# Patient Record
Sex: Female | Born: 1970 | Race: White | Hispanic: No | Smoking: Current every day smoker
Health system: Southern US, Community
[De-identification: ages and names within clinical notes are randomized; demographics above are authoritative.]

## PROBLEM LIST (undated history)

## (undated) DIAGNOSIS — T8859XA Other complications of anesthesia, initial encounter: Secondary | ICD-10-CM

## (undated) DIAGNOSIS — G5603 Carpal tunnel syndrome, bilateral upper limbs: Secondary | ICD-10-CM

## (undated) DIAGNOSIS — J45909 Unspecified asthma, uncomplicated: Secondary | ICD-10-CM

## (undated) DIAGNOSIS — J302 Other seasonal allergic rhinitis: Secondary | ICD-10-CM

## (undated) DIAGNOSIS — N159 Renal tubulo-interstitial disease, unspecified: Secondary | ICD-10-CM

## (undated) DIAGNOSIS — M75102 Unspecified rotator cuff tear or rupture of left shoulder, not specified as traumatic: Secondary | ICD-10-CM

## (undated) DIAGNOSIS — M25812 Other specified joint disorders, left shoulder: Secondary | ICD-10-CM

## (undated) DIAGNOSIS — Z972 Presence of dental prosthetic device (complete) (partial): Secondary | ICD-10-CM

## (undated) DIAGNOSIS — F32A Depression, unspecified: Secondary | ICD-10-CM

## (undated) DIAGNOSIS — J069 Acute upper respiratory infection, unspecified: Secondary | ICD-10-CM

## (undated) DIAGNOSIS — M7542 Impingement syndrome of left shoulder: Secondary | ICD-10-CM

## (undated) DIAGNOSIS — Z720 Tobacco use: Secondary | ICD-10-CM

## (undated) DIAGNOSIS — J381 Polyp of vocal cord and larynx: Secondary | ICD-10-CM

## (undated) DIAGNOSIS — M1990 Unspecified osteoarthritis, unspecified site: Secondary | ICD-10-CM

## (undated) DIAGNOSIS — K219 Gastro-esophageal reflux disease without esophagitis: Secondary | ICD-10-CM

## (undated) DIAGNOSIS — F329 Major depressive disorder, single episode, unspecified: Secondary | ICD-10-CM

## (undated) DIAGNOSIS — F419 Anxiety disorder, unspecified: Secondary | ICD-10-CM

## (undated) DIAGNOSIS — Z8719 Personal history of other diseases of the digestive system: Secondary | ICD-10-CM

## (undated) DIAGNOSIS — T4145XA Adverse effect of unspecified anesthetic, initial encounter: Secondary | ICD-10-CM

## (undated) DIAGNOSIS — Z973 Presence of spectacles and contact lenses: Secondary | ICD-10-CM

## (undated) DIAGNOSIS — J309 Allergic rhinitis, unspecified: Secondary | ICD-10-CM

## (undated) DIAGNOSIS — J449 Chronic obstructive pulmonary disease, unspecified: Secondary | ICD-10-CM

## (undated) HISTORY — DX: Renal tubulo-interstitial disease, unspecified: N15.9

## (undated) HISTORY — PX: TONSILLECTOMY: SUR1361

## (undated) HISTORY — PX: ABDOMINAL HYSTERECTOMY: SHX81

## (undated) HISTORY — PX: CHOLECYSTECTOMY: SHX55

## (undated) HISTORY — DX: Acute upper respiratory infection, unspecified: J06.9

## (undated) HISTORY — DX: Tobacco use: Z72.0

## (undated) HISTORY — PX: MICROLARYNGOSCOPY: SHX5208

## (undated) HISTORY — DX: Polyp of vocal cord and larynx: J38.1

## (undated) HISTORY — DX: Carpal tunnel syndrome, bilateral upper limbs: G56.03

## (undated) HISTORY — PX: TOTAL LAPAROSCOPIC HYSTERECTOMY WITH SALPINGECTOMY: SHX6742

## (undated) HISTORY — PX: TUBAL LIGATION: SHX77

---

## 1998-07-28 ENCOUNTER — Emergency Department (HOSPITAL_COMMUNITY): Admission: EM | Admit: 1998-07-28 | Discharge: 1998-07-28 | Payer: Self-pay | Admitting: Emergency Medicine

## 1998-07-28 ENCOUNTER — Encounter: Payer: Self-pay | Admitting: Emergency Medicine

## 1998-07-29 ENCOUNTER — Encounter: Payer: Self-pay | Admitting: Emergency Medicine

## 1998-07-29 ENCOUNTER — Emergency Department (HOSPITAL_COMMUNITY): Admission: EM | Admit: 1998-07-29 | Discharge: 1998-07-29 | Payer: Self-pay | Admitting: Emergency Medicine

## 1998-11-14 ENCOUNTER — Emergency Department (HOSPITAL_COMMUNITY): Admission: EM | Admit: 1998-11-14 | Discharge: 1998-11-14 | Payer: Self-pay | Admitting: Emergency Medicine

## 1998-11-22 ENCOUNTER — Emergency Department (HOSPITAL_COMMUNITY): Admission: EM | Admit: 1998-11-22 | Discharge: 1998-11-22 | Payer: Self-pay | Admitting: Emergency Medicine

## 1999-02-18 ENCOUNTER — Emergency Department (HOSPITAL_COMMUNITY): Admission: EM | Admit: 1999-02-18 | Discharge: 1999-02-18 | Payer: Self-pay | Admitting: Emergency Medicine

## 1999-02-23 ENCOUNTER — Encounter: Payer: Self-pay | Admitting: Emergency Medicine

## 1999-02-23 ENCOUNTER — Emergency Department (HOSPITAL_COMMUNITY): Admission: EM | Admit: 1999-02-23 | Discharge: 1999-02-23 | Payer: Self-pay | Admitting: Emergency Medicine

## 1999-02-24 ENCOUNTER — Inpatient Hospital Stay (HOSPITAL_COMMUNITY): Admission: AD | Admit: 1999-02-24 | Discharge: 1999-02-24 | Payer: Self-pay | Admitting: *Deleted

## 1999-02-26 ENCOUNTER — Emergency Department (HOSPITAL_COMMUNITY): Admission: EM | Admit: 1999-02-26 | Discharge: 1999-02-26 | Payer: Self-pay | Admitting: *Deleted

## 1999-03-18 ENCOUNTER — Encounter: Payer: Self-pay | Admitting: Family Medicine

## 1999-03-18 ENCOUNTER — Ambulatory Visit (HOSPITAL_COMMUNITY): Admission: RE | Admit: 1999-03-18 | Discharge: 1999-03-18 | Payer: Self-pay | Admitting: Family Medicine

## 1999-04-13 ENCOUNTER — Ambulatory Visit (HOSPITAL_COMMUNITY): Admission: RE | Admit: 1999-04-13 | Discharge: 1999-04-13 | Payer: Self-pay | Admitting: Gastroenterology

## 1999-04-13 ENCOUNTER — Encounter: Payer: Self-pay | Admitting: Gastroenterology

## 1999-05-18 ENCOUNTER — Inpatient Hospital Stay (HOSPITAL_COMMUNITY): Admission: AD | Admit: 1999-05-18 | Discharge: 1999-05-18 | Payer: Self-pay | Admitting: Obstetrics

## 1999-07-07 ENCOUNTER — Emergency Department (HOSPITAL_COMMUNITY): Admission: EM | Admit: 1999-07-07 | Discharge: 1999-07-07 | Payer: Self-pay | Admitting: Emergency Medicine

## 1999-07-16 ENCOUNTER — Encounter: Admission: RE | Admit: 1999-07-16 | Discharge: 1999-07-16 | Payer: Self-pay | Admitting: Obstetrics

## 1999-07-31 ENCOUNTER — Encounter: Payer: Self-pay | Admitting: Emergency Medicine

## 1999-07-31 ENCOUNTER — Emergency Department (HOSPITAL_COMMUNITY): Admission: EM | Admit: 1999-07-31 | Discharge: 1999-07-31 | Payer: Self-pay | Admitting: *Deleted

## 1999-08-05 ENCOUNTER — Encounter: Payer: Self-pay | Admitting: *Deleted

## 1999-08-05 ENCOUNTER — Inpatient Hospital Stay (HOSPITAL_COMMUNITY): Admission: AD | Admit: 1999-08-05 | Discharge: 1999-08-05 | Payer: Self-pay | Admitting: *Deleted

## 1999-08-26 ENCOUNTER — Inpatient Hospital Stay (HOSPITAL_COMMUNITY): Admission: AD | Admit: 1999-08-26 | Discharge: 1999-08-26 | Payer: Self-pay | Admitting: Obstetrics

## 1999-08-26 ENCOUNTER — Emergency Department (HOSPITAL_COMMUNITY): Admission: EM | Admit: 1999-08-26 | Discharge: 1999-08-26 | Payer: Self-pay

## 1999-09-16 ENCOUNTER — Encounter (INDEPENDENT_AMBULATORY_CARE_PROVIDER_SITE_OTHER): Payer: Self-pay

## 1999-09-16 ENCOUNTER — Ambulatory Visit (HOSPITAL_COMMUNITY): Admission: RE | Admit: 1999-09-16 | Discharge: 1999-09-16 | Payer: Self-pay | Admitting: *Deleted

## 1999-09-16 HISTORY — PX: DIAGNOSTIC LAPAROSCOPY: SUR761

## 1999-10-16 ENCOUNTER — Ambulatory Visit (HOSPITAL_COMMUNITY): Admission: RE | Admit: 1999-10-16 | Discharge: 1999-10-16 | Payer: Self-pay | Admitting: Gastroenterology

## 2001-05-30 ENCOUNTER — Emergency Department (HOSPITAL_COMMUNITY): Admission: EM | Admit: 2001-05-30 | Discharge: 2001-05-30 | Payer: Self-pay | Admitting: Emergency Medicine

## 2001-06-16 ENCOUNTER — Encounter: Admission: RE | Admit: 2001-06-16 | Discharge: 2001-06-16 | Payer: Self-pay | Admitting: Gastroenterology

## 2001-06-16 ENCOUNTER — Encounter: Payer: Self-pay | Admitting: Gastroenterology

## 2001-06-29 ENCOUNTER — Ambulatory Visit (HOSPITAL_COMMUNITY): Admission: RE | Admit: 2001-06-29 | Discharge: 2001-06-29 | Payer: Self-pay | Admitting: Gastroenterology

## 2001-06-29 ENCOUNTER — Encounter (INDEPENDENT_AMBULATORY_CARE_PROVIDER_SITE_OTHER): Payer: Self-pay | Admitting: Specialist

## 2001-09-14 ENCOUNTER — Ambulatory Visit (HOSPITAL_COMMUNITY): Admission: RE | Admit: 2001-09-14 | Discharge: 2001-09-14 | Payer: Self-pay | Admitting: Gastroenterology

## 2001-09-14 ENCOUNTER — Encounter: Payer: Self-pay | Admitting: Gastroenterology

## 2001-09-24 ENCOUNTER — Emergency Department (HOSPITAL_COMMUNITY): Admission: EM | Admit: 2001-09-24 | Discharge: 2001-09-24 | Payer: Self-pay | Admitting: Emergency Medicine

## 2002-03-30 ENCOUNTER — Emergency Department (HOSPITAL_COMMUNITY): Admission: EM | Admit: 2002-03-30 | Discharge: 2002-03-30 | Payer: Self-pay | Admitting: Emergency Medicine

## 2002-04-07 ENCOUNTER — Emergency Department (HOSPITAL_COMMUNITY): Admission: EM | Admit: 2002-04-07 | Discharge: 2002-04-07 | Payer: Self-pay | Admitting: Emergency Medicine

## 2002-06-04 ENCOUNTER — Encounter: Payer: Self-pay | Admitting: *Deleted

## 2002-06-04 ENCOUNTER — Inpatient Hospital Stay (HOSPITAL_COMMUNITY): Admission: AD | Admit: 2002-06-04 | Discharge: 2002-06-04 | Payer: Self-pay | Admitting: *Deleted

## 2002-06-26 ENCOUNTER — Encounter: Admission: RE | Admit: 2002-06-26 | Discharge: 2002-06-26 | Payer: Self-pay | Admitting: *Deleted

## 2002-10-05 ENCOUNTER — Emergency Department (HOSPITAL_COMMUNITY): Admission: EM | Admit: 2002-10-05 | Discharge: 2002-10-05 | Payer: Self-pay | Admitting: Emergency Medicine

## 2002-11-21 ENCOUNTER — Emergency Department (HOSPITAL_COMMUNITY): Admission: EM | Admit: 2002-11-21 | Discharge: 2002-11-21 | Payer: Self-pay | Admitting: Emergency Medicine

## 2002-11-21 ENCOUNTER — Encounter: Payer: Self-pay | Admitting: Emergency Medicine

## 2003-10-25 ENCOUNTER — Encounter: Admission: RE | Admit: 2003-10-25 | Discharge: 2003-10-25 | Payer: Self-pay | Admitting: General Surgery

## 2004-04-08 ENCOUNTER — Inpatient Hospital Stay (HOSPITAL_COMMUNITY): Admission: AD | Admit: 2004-04-08 | Discharge: 2004-04-08 | Payer: Self-pay | Admitting: Obstetrics and Gynecology

## 2004-06-04 ENCOUNTER — Emergency Department (HOSPITAL_COMMUNITY): Admission: EM | Admit: 2004-06-04 | Discharge: 2004-06-04 | Payer: Self-pay | Admitting: Emergency Medicine

## 2006-02-27 ENCOUNTER — Emergency Department (HOSPITAL_COMMUNITY): Admission: EM | Admit: 2006-02-27 | Discharge: 2006-02-28 | Payer: Self-pay | Admitting: Emergency Medicine

## 2006-08-12 ENCOUNTER — Emergency Department (HOSPITAL_COMMUNITY): Admission: EM | Admit: 2006-08-12 | Discharge: 2006-08-13 | Payer: Self-pay | Admitting: Emergency Medicine

## 2009-08-16 HISTORY — PX: HERNIA REPAIR: SHX51

## 2009-09-01 ENCOUNTER — Ambulatory Visit (HOSPITAL_COMMUNITY): Admission: RE | Admit: 2009-09-01 | Discharge: 2009-09-01 | Payer: Self-pay | Admitting: Family Medicine

## 2010-09-06 ENCOUNTER — Encounter: Payer: Self-pay | Admitting: Family Medicine

## 2010-12-28 ENCOUNTER — Emergency Department (HOSPITAL_COMMUNITY)
Admission: EM | Admit: 2010-12-28 | Discharge: 2010-12-28 | Disposition: A | Discharge status: Home / Self Care | Payer: Self-pay | Attending: Emergency Medicine | Admitting: Emergency Medicine

## 2010-12-28 DIAGNOSIS — S61209A Unspecified open wound of unspecified finger without damage to nail, initial encounter: Secondary | ICD-10-CM | POA: Insufficient documentation

## 2010-12-28 DIAGNOSIS — W268XXA Contact with other sharp object(s), not elsewhere classified, initial encounter: Secondary | ICD-10-CM | POA: Insufficient documentation

## 2010-12-28 DIAGNOSIS — Y92009 Unspecified place in unspecified non-institutional (private) residence as the place of occurrence of the external cause: Secondary | ICD-10-CM | POA: Insufficient documentation

## 2011-01-01 NOTE — Op Note (Signed)
[facility]  Patient:    Makayla Reid                       MRN: [id number] Proc. Date: 09/16/99 Adm. Date:  [id number] Attending:  Pleas Koch                           Operative Report  PREOPERATIVE DIAGNOSIS:  Chronic left lower quadrant pain.  POSTOPERATIVE DIAGNOSIS:  Chronic left lower quadrant pain.  OPERATION:  Diagnostic laparoscopy with biopsy of the left uterosacral ligament and left uterosacral nerve ablation.  SURGEON:  Georgina Peer, M.D.  ANESTHESIA:  General.  ESTIMATED BLOOD LOSS:  Less than 25 cc.  FINDINGS:  Prominent left uterosacral ligament but no visible pathology seen in the pelvis.  INDICATIONS:  A 40 year old gravida 1, para 1 with left lower quadrant pain and  exhaustive GI work-up which was negative.  Negative ultrasound and CT scan. Negative pelvic cultures.  Patient was brought in for diagnosis.  She is aware f risks and complications of the procedure including bowel, bladder, vascular injury, ureteral injury and possibility of finding no evidence for the pain.  DESCRIPTION OF PROCEDURE:  The patient was taken to the operating room and given general anesthetic and placed in a dorsal lithotomy position.  The abdomen, perineum and vagina were prepped and draped sterilely.  Bladder was emptied with the catheter.  A bimanual examination under anesthesia revealed an anterior mobile uterus with no adnexal masses.  There was some prominence of the left uterosacral ligament.  A vertical subumbilical incision was then made and a Veress needle placed into the abdominal cavity confirmed by low insufflation pressures and a normal water drop test.  Laparoscope trocar and sleeve were introduced after 1.6L of carbon dioxide was insufflated.  A 5 mm suprapubic port was placed under direct vision and the following pelvic findings were noted.  It was noted there was no  apparent injury from the laparoscopic  trocar placement.  There was small amount of serosanguinous fluid in the cul-de-sac.  There was evidence of ovulation and a corpus luteum cyst on the right ovary.  The left ovary appeared normal without evidence of cyst, tumor, adhesions or endometriosus.  The left tube appeared normal.  There were no adhesions to the bowel.  The surface of the bowel appeared normal.  The anterior surface of the peritoneum and bladder flap appeared normal. The uterosacral ligaments had no visible lesions; however, the left uterosacral  ligament was more prominent than the right although there was no nodularity or apparent scar tissue on palpation of this with a probe.  The right uterosacral ligament was more attenuated.  The ureters bilaterally appeared normal.  The pelvic side walls appeared normal.  The appendix appeared normal.  The upper abdomen appeared normal.  Putting the uterosacral ligaments on tension, the ligaments were biopsied and the biopsies sent to pathology.  This was in an area that was tender on exam.  Then cauterization and transection of the left uterosacral ligament to ablate the uterine nerves was then accomplished.  10 cc of 0.25% Marcaine plain was then drizzled down the operative site.  The gas was allowed to escape.  The incisional ports injected with a total of 6 cc of Marcaine and the ports were closed with 4-0 subcuticular Dexon.  Vaginal instrument was removed.  Sponge, needle and instrument counts were correct.  The patient  was transferred to the recovery area in good condition. DD:  09/16/99 TD:  09/16/99 Job: [id number]

## 2011-05-23 ENCOUNTER — Emergency Department (HOSPITAL_COMMUNITY): Payer: Self-pay

## 2011-05-23 ENCOUNTER — Emergency Department (HOSPITAL_COMMUNITY)
Admission: EM | Admit: 2011-05-23 | Discharge: 2011-05-23 | Disposition: A | Discharge status: Home / Self Care | Payer: Self-pay | Attending: Emergency Medicine | Admitting: Emergency Medicine

## 2011-05-23 DIAGNOSIS — F17200 Nicotine dependence, unspecified, uncomplicated: Secondary | ICD-10-CM | POA: Insufficient documentation

## 2011-05-23 DIAGNOSIS — J40 Bronchitis, not specified as acute or chronic: Secondary | ICD-10-CM | POA: Insufficient documentation

## 2011-05-23 DIAGNOSIS — R059 Cough, unspecified: Secondary | ICD-10-CM | POA: Insufficient documentation

## 2011-05-23 DIAGNOSIS — J329 Chronic sinusitis, unspecified: Secondary | ICD-10-CM | POA: Insufficient documentation

## 2011-05-23 DIAGNOSIS — R05 Cough: Secondary | ICD-10-CM | POA: Insufficient documentation

## 2011-05-23 DIAGNOSIS — R062 Wheezing: Secondary | ICD-10-CM | POA: Insufficient documentation

## 2013-05-19 ENCOUNTER — Emergency Department (HOSPITAL_BASED_OUTPATIENT_CLINIC_OR_DEPARTMENT_OTHER): Payer: BC Managed Care – PPO

## 2013-05-19 ENCOUNTER — Encounter (HOSPITAL_BASED_OUTPATIENT_CLINIC_OR_DEPARTMENT_OTHER): Payer: Self-pay | Admitting: Emergency Medicine

## 2013-05-19 ENCOUNTER — Emergency Department (HOSPITAL_BASED_OUTPATIENT_CLINIC_OR_DEPARTMENT_OTHER)
Admission: EM | Admit: 2013-05-19 | Discharge: 2013-05-19 | Disposition: A | Discharge status: Home / Self Care | Payer: BC Managed Care – PPO | Attending: Emergency Medicine | Admitting: Emergency Medicine

## 2013-05-19 DIAGNOSIS — J3489 Other specified disorders of nose and nasal sinuses: Secondary | ICD-10-CM | POA: Insufficient documentation

## 2013-05-19 DIAGNOSIS — F17200 Nicotine dependence, unspecified, uncomplicated: Secondary | ICD-10-CM | POA: Insufficient documentation

## 2013-05-19 DIAGNOSIS — R059 Cough, unspecified: Secondary | ICD-10-CM | POA: Insufficient documentation

## 2013-05-19 DIAGNOSIS — Z79899 Other long term (current) drug therapy: Secondary | ICD-10-CM | POA: Insufficient documentation

## 2013-05-19 DIAGNOSIS — Z3202 Encounter for pregnancy test, result negative: Secondary | ICD-10-CM | POA: Insufficient documentation

## 2013-05-19 DIAGNOSIS — B349 Viral infection, unspecified: Secondary | ICD-10-CM | POA: Diagnosis present

## 2013-05-19 DIAGNOSIS — Z880 Allergy status to penicillin: Secondary | ICD-10-CM | POA: Insufficient documentation

## 2013-05-19 DIAGNOSIS — R52 Pain, unspecified: Secondary | ICD-10-CM | POA: Insufficient documentation

## 2013-05-19 DIAGNOSIS — B9789 Other viral agents as the cause of diseases classified elsewhere: Secondary | ICD-10-CM | POA: Insufficient documentation

## 2013-05-19 DIAGNOSIS — R05 Cough: Secondary | ICD-10-CM | POA: Insufficient documentation

## 2013-05-19 LAB — URINALYSIS, ROUTINE W REFLEX MICROSCOPIC
Ketones, ur: NEGATIVE mg/dL
Leukocytes, UA: NEGATIVE
Nitrite: NEGATIVE
Specific Gravity, Urine: 1.007 (ref 1.005–1.030)

## 2013-05-19 LAB — PREGNANCY, URINE: Preg Test, Ur: NEGATIVE

## 2013-05-19 MED ORDER — KETOROLAC TROMETHAMINE 30 MG/ML IJ SOLN
30.0000 mg | Freq: Once | INTRAMUSCULAR | Status: AC
  Administered 2013-05-19: 30 mg via INTRAVENOUS
  Filled 2013-05-19: qty 1

## 2013-05-19 MED ORDER — DIPHENHYDRAMINE HCL 50 MG/ML IJ SOLN
25.0000 mg | Freq: Once | INTRAMUSCULAR | Status: AC
  Administered 2013-05-19: 25 mg via INTRAVENOUS
  Filled 2013-05-19: qty 1

## 2013-05-19 MED ORDER — METOCLOPRAMIDE HCL 5 MG/ML IJ SOLN
5.0000 mg | Freq: Once | INTRAMUSCULAR | Status: AC
  Administered 2013-05-19: 5 mg via INTRAVENOUS
  Filled 2013-05-19: qty 2

## 2013-05-19 MED ORDER — SODIUM CHLORIDE 0.9 % IV BOLUS (SEPSIS)
1000.0000 mL | INTRAVENOUS | Status: AC
  Administered 2013-05-19: 1000 mL via INTRAVENOUS

## 2013-05-19 NOTE — ED Provider Notes (Signed)
CSN: [id number]     Arrival date & time 05/19/13  6578 History   First MD Initiated Contact with Patient 05/19/13 [id number]     Chief Complaint  Patient presents with  . Headache  . Generalized Body Aches   (Consider location/radiation/quality/duration/timing/severity/associated sxs/prior Treatment) Patient is a 42 y.o. female presenting with headaches. The history is provided by the patient.  Headache Pain location:  Frontal Quality:  Dull Radiates to:  Does not radiate Severity currently:  5/10 Severity at highest:  8/10 Onset quality:  Gradual Duration:  5 days Timing:  Intermittent Progression:  Waxing and waning Chronicity:  New Similar to prior headaches: no   Relieved by:  Aspirin Worsened by:  Nothing tried Ineffective treatments:  None tried Associated symptoms: congestion and cough   Associated symptoms: no abdominal pain, no back pain, no diarrhea, no dizziness, no pain, no fatigue, no fever, no nausea, no neck pain and no vomiting     History reviewed. No pertinent past medical history. Past Surgical History  Procedure Laterality Date  . Cholecystectomy    . Tubal ligation     No family history on file. History  Substance Use Topics  . Smoking status: Current Every Day Smoker  . Smokeless tobacco: Not on file  . Alcohol Use: No   OB History   Grav Para Term Preterm Abortions TAB SAB Ect Mult Living                 Review of Systems  Constitutional: Negative for fever, appetite change and fatigue.  HENT: Positive for congestion. Negative for drooling and neck pain.   Eyes: Negative for pain.  Respiratory: Positive for cough. Negative for shortness of breath.   Cardiovascular: Negative for chest pain.  Gastrointestinal: Negative for nausea, vomiting, abdominal pain and diarrhea.  Genitourinary: Negative for dysuria and hematuria.  Musculoskeletal: Negative for back pain and gait problem.  Skin: Negative for color change.  Neurological: Positive for  headaches. Negative for dizziness.  Hematological: Negative for adenopathy.  Psychiatric/Behavioral: Negative for behavioral problems.  All other systems reviewed and are negative.    Allergies  Penicillins  Home Medications   Current Outpatient Rx  Name  Route  Sig  Dispense  Refill  . ALPRAZolam (XANAX) 1 MG tablet   Oral   Take 1 mg by mouth at bedtime as needed for sleep.         . fish oil-omega-3 fatty acids 1000 MG capsule   Oral   Take 2 g by mouth daily.          BP 120/78  Pulse 70  Temp(Src) 98.5 F (36.9 C) (Oral)  Resp 18  Ht 5\' 4"  (1.626 m)  Wt 102 lb (46.267 kg)  BMI 17.5 kg/m2  SpO2 100% Physical Exam  Nursing note and vitals reviewed. Constitutional: She is oriented to person, place, and time. She appears well-developed and well-nourished.  HENT:  Head: Normocephalic.  Mouth/Throat: Oropharynx is clear and moist. No oropharyngeal exudate.  Eyes: Conjunctivae and EOM are normal. Pupils are equal, round, and reactive to light.  Neck: Normal range of motion. Neck supple.  Cardiovascular: Normal rate, regular rhythm, normal heart sounds and intact distal pulses.  Exam reveals no gallop and no friction rub.   No murmur heard. Pulmonary/Chest: Effort normal and breath sounds normal. No respiratory distress. She has no wheezes.  Abdominal: Soft. Bowel sounds are normal. There is no tenderness. There is no rebound and no guarding.  Musculoskeletal: Normal range  of motion. She exhibits no edema and no tenderness.  Neurological: She is alert and oriented to person, place, and time.  Skin: Skin is warm and dry.  Psychiatric: She has a normal mood and affect. Her behavior is normal.    ED Course  Procedures (including critical care time) Labs Review Labs Reviewed  URINALYSIS, ROUTINE W REFLEX MICROSCOPIC - Abnormal; Notable for the following:    APPearance CLOUDY (*)    All other components within normal limits  PREGNANCY, URINE   Imaging Review Dg  Chest 2 View  05/19/2013   CLINICAL DATA:  Cough, chills and hot flashes for 5 days  EXAM: CHEST  2 VIEW  COMPARISON:  05/23/2011  FINDINGS: The heart size and mediastinal contours are within normal limits. The lungs are hyperinflated but clear. The visualized skeletal structures are unremarkable.  IMPRESSION: 1. No pneumonia.  2. Hyperinflation.   Electronically Signed   By: Signa Kell M.D.   On: 05/19/2013 08:41    MDM   1. HA (headache)   2. Viral syndrome    7:37 AM 42 y.o. female who presents with intermittent headache and body aches for 5 days. Patient denies any fever. She notes she has had a dry cough. She is afebrile and vital signs are unremarkable here. She appears well on exam. Vital signs are unremarkable. I suspect she has a viral syndrome, but will get a chest x-ray to rule out pneumonia. Headache is currently a 5/10. Will hydrate with IV fluid and treat with a migraine cocktail.  9:37 AM: I interpreted/reviewed the labs and/or imaging which were non-contributory.  Pt feeling much better and would like to go home.  I have discussed the diagnosis/risks/treatment options with the patient and believe the pt to be eligible for discharge home to follow-up with pcp as needed. We also discussed returning to the ED immediately if new or worsening sx occur. We discussed the sx which are most concerning (e.g., fever, worsening HA) that necessitate immediate return. Any new prescriptions provided to the patient are listed below.   Junius Argyle, MD 05/19/13 [phone]

## 2013-05-19 NOTE — ED Notes (Signed)
Pt c/o headache, body aches, chills and hot flashes x 5 days.

## 2014-04-24 ENCOUNTER — Emergency Department (HOSPITAL_COMMUNITY)
Admission: EM | Admit: 2014-04-24 | Discharge: 2014-04-24 | Disposition: A | Discharge status: Home / Self Care | Payer: BC Managed Care – PPO | Attending: Emergency Medicine | Admitting: Emergency Medicine

## 2014-04-24 ENCOUNTER — Emergency Department (HOSPITAL_COMMUNITY): Payer: BC Managed Care – PPO

## 2014-04-24 ENCOUNTER — Encounter (HOSPITAL_COMMUNITY): Payer: Self-pay | Admitting: Emergency Medicine

## 2014-04-24 DIAGNOSIS — F17200 Nicotine dependence, unspecified, uncomplicated: Secondary | ICD-10-CM | POA: Insufficient documentation

## 2014-04-24 DIAGNOSIS — Z880 Allergy status to penicillin: Secondary | ICD-10-CM | POA: Insufficient documentation

## 2014-04-24 DIAGNOSIS — R059 Cough, unspecified: Secondary | ICD-10-CM | POA: Insufficient documentation

## 2014-04-24 DIAGNOSIS — R05 Cough: Secondary | ICD-10-CM | POA: Diagnosis present

## 2014-04-24 DIAGNOSIS — Z79899 Other long term (current) drug therapy: Secondary | ICD-10-CM | POA: Insufficient documentation

## 2014-04-24 DIAGNOSIS — J069 Acute upper respiratory infection, unspecified: Secondary | ICD-10-CM | POA: Diagnosis not present

## 2014-04-24 LAB — CBC WITH DIFFERENTIAL/PLATELET
BASOS ABS: 0 10*3/uL (ref 0.0–0.1)
Basophils Relative: 0 % (ref 0–1)
EOS PCT: 6 % — AB (ref 0–5)
Eosinophils Absolute: 0.4 10*3/uL (ref 0.0–0.7)
HCT: 39.9 % (ref 36.0–46.0)
Hemoglobin: 13.4 g/dL (ref 12.0–15.0)
LYMPHS ABS: 3.1 10*3/uL (ref 0.7–4.0)
Lymphocytes Relative: 48 % — ABNORMAL HIGH (ref 12–46)
MCH: 31.5 pg (ref 26.0–34.0)
MCHC: 33.6 g/dL (ref 30.0–36.0)
MCV: 93.7 fL (ref 78.0–100.0)
MONO ABS: 0.4 10*3/uL (ref 0.1–1.0)
Monocytes Relative: 6 % (ref 3–12)
NEUTROS ABS: 2.6 10*3/uL (ref 1.7–7.7)
Neutrophils Relative %: 40 % — ABNORMAL LOW (ref 43–77)
PLATELETS: 167 10*3/uL (ref 150–400)
RBC: 4.26 MIL/uL (ref 3.87–5.11)
RDW: 12.2 % (ref 11.5–15.5)
WBC: 6.6 10*3/uL (ref 4.0–10.5)

## 2014-04-24 LAB — BASIC METABOLIC PANEL
ANION GAP: 11 (ref 5–15)
BUN: 14 mg/dL (ref 6–23)
CO2: 27 meq/L (ref 19–32)
Calcium: 9.6 mg/dL (ref 8.4–10.5)
Chloride: 106 mEq/L (ref 96–112)
Creatinine, Ser: 0.74 mg/dL (ref 0.50–1.10)
GFR calc non Af Amer: >90 mL/min (ref >90)
GLUCOSE: 102 mg/dL — AB (ref 70–99)
Potassium: 4.4 mEq/L (ref 3.7–5.3)
Sodium: 144 mEq/L (ref 137–147)

## 2014-04-24 LAB — I-STAT TROPONIN, ED: TROPONIN I, POC: 0 ng/mL (ref 0.00–0.08)

## 2014-04-24 MED ORDER — PREDNISONE 20 MG PO TABS
60.0000 mg | ORAL_TABLET | Freq: Once | ORAL | Status: AC
  Administered 2014-04-24: 60 mg via ORAL
  Filled 2014-04-24: qty 3

## 2014-04-24 MED ORDER — DOXYCYCLINE HYCLATE 100 MG PO CAPS: 100.0000 mg | ORAL_CAPSULE | Freq: Two times a day (BID) | ORAL | Status: DC

## 2014-04-24 MED ORDER — ALBUTEROL SULFATE HFA 108 (90 BASE) MCG/ACT IN AERS: 2.0000 | INHALATION_SPRAY | RESPIRATORY_TRACT | Status: DC | PRN

## 2014-04-24 MED ORDER — PREDNISONE 20 MG PO TABS: 60.0000 mg | ORAL_TABLET | Freq: Every day | ORAL | Status: DC

## 2014-04-24 MED ORDER — IPRATROPIUM-ALBUTEROL 0.5-2.5 (3) MG/3ML IN SOLN
3.0000 mL | Freq: Once | RESPIRATORY_TRACT | Status: AC
  Administered 2014-04-24: 3 mL via RESPIRATORY_TRACT
  Filled 2014-04-24: qty 3

## 2014-04-24 NOTE — ED Notes (Signed)
Patient c/o cough, left sided rib pain, and "gurgling" in her throat. Patient states September 2014 she was diagnosed with pneumonia by her PCP, given Zithromax which cleared her symptoms initially but then they returned. Patient denies any additional follow up with her PCP prior to today when she was unable to get in to see her PCP. Patient states over the past two weeks she has been progressively feeling worse, with new onset fatigue and weakness.

## 2014-04-24 NOTE — ED Provider Notes (Signed)
TIME SEEN: 9:03 PM  CHIEF COMPLAINT: Left-sided chest pain, shortness of breath, cough  HPI: Patient is a 43 year old female with history of tobacco use who presents to the emergency department with complaints of several weeks of left-sided chest pain that is sharp in nature, shortness of breath, cough. She states that she is coughing up yellow sputum. No fevers or chills. She states she left-sided chest pain is better when she lies on that side. It is not exertional. Not pleuritic. History of PE or DVT. No lower extremity swelling or pain. No history of asthma or COPD. No history of hypertension, diabetes, hyperlipidemia or CAD.  ROS: See HPI Constitutional: no fever  Eyes: no drainage  ENT: no runny nose   Cardiovascular:   chest pain  Resp: SOB  GI: no vomiting GU: no dysuria Integumentary: no rash  Allergy: no hives  Musculoskeletal: no leg swelling  Neurological: no slurred speech ROS otherwise negative  PAST MEDICAL HISTORY/PAST SURGICAL HISTORY:  History reviewed. No pertinent past medical history.  MEDICATIONS:  Prior to Admission medications   Medication Sig Start Date End Date Taking? Authorizing Provider  ALPRAZolam Prudy Feeler) 1 MG tablet Take 1 mg by mouth at bedtime as needed for sleep.    Historical Provider, MD  fish oil-omega-3 fatty acids 1000 MG capsule Take 2 g by mouth daily.    Historical Provider, MD    ALLERGIES:  Allergies  Allergen Reactions  . Penicillins     SOCIAL HISTORY:  History  Substance Use Topics  . Smoking status: Current Every Day Smoker -- 0.50 packs/day    Types: Cigarettes  . Smokeless tobacco: Not on file  . Alcohol Use: No     Comment: socially    FAMILY HISTORY: No family history on file.  EXAM: BP 109/68  Pulse 72  Temp(Src) 98.1 F (36.7 C) (Oral)  Resp 16  SpO2 99%  LMP 04/02/2014 CONSTITUTIONAL: Alert and oriented and responds appropriately to questions. Well-appearing; well-nourished HEAD: Normocephalic EYES:  Conjunctivae clear, PERRL ENT: normal nose; no rhinorrhea; moist mucous membranes; pharynx without lesions noted NECK: Supple, no meningismus, no LAD  CARD: RRR; S1 and S2 appreciated; no murmurs, no clicks, no rubs, no gallops RESP: Normal chest excursion without splinting or tachypnea; breath sounds equal bilaterally but there are diffuse rhonchi and scattered wheezing, no rales, speaking full sentences, no respiratory distress or hypoxia, no increased work of breathing, patient is tender to palpation over her left chest wall without crepitus or ecchymosis or deformity ABD/GI: Normal bowel sounds; non-distended; soft, non-tender, no rebound, no guarding BACK:  The back appears normal and is non-tender to palpation, there is no CVA tenderness EXT: Normal ROM in all joints; non-tender to palpation; no edema; normal capillary refill; no cyanosis    SKIN: Normal color for age and race; warm NEURO: Moves all extremities equally PSYCH: The patient's mood and manner are appropriate. Grooming and personal hygiene are appropriate.  MEDICAL DECISION MAKING: Patient here with left-sided chest pain that is reproducible with palpation. Likely chest wall pain. She is also having cough, shortness of breath. This may be related to a URI and her history of tobacco use. We'll give duo neb, prednisone. Chest x-ray is clear. We'll obtain labs including troponin.  ED PROGRESS: Patient's lungs sound much better after DuoNeb. She states she is feeling much better. Her labs are unremarkable. Troponin negative. I feel she is safe to be discharged home. Given she has had productive sputum that is yellow appearing and is  a smoker, will discharge home on doxycycline to cover for atypical pneumonia. She states azithromycin causes severe abdominal cramping. We'll discharge with albuterol and prednisone. Discussed return precautions and supportive care instructions. She verbalizes understanding and is comfortable with  plan.     EKG Interpretation  Date/Time:  Wednesday April 24 2014 21:58:51 EDT Ventricular Rate:  58 PR Interval:  137 QRS Duration: 72 QT Interval:  425 QTC Calculation: 417 R Axis:   80 Text Interpretation:  Sinus rhythm Anterior infarct, old Confirmed by Arend Bahl,  DO, Zarielle Cea ([id number]) on 04/24/2014 10:06:05 PM        Layla Maw Idriss Quackenbush, DO 04/24/14 [id number]

## 2014-04-24 NOTE — Discharge Instructions (Signed)
Upper Respiratory Infection, Adult An upper respiratory infection (URI) is also sometimes known as the common cold. The upper respiratory tract includes the nose, sinuses, throat, trachea, and bronchi. Bronchi are the airways leading to the lungs. Most people improve within 1 week, but symptoms can last up to 2 weeks. A residual cough may last even longer.  CAUSES Many different viruses can infect the tissues lining the upper respiratory tract. The tissues become irritated and inflamed and often become very moist. Mucus production is also common. A cold is contagious. You can easily spread the virus to others by oral contact. This includes kissing, sharing a glass, coughing, or sneezing. Touching your mouth or nose and then touching a surface, which is then touched by another person, can also spread the virus. SYMPTOMS  Symptoms typically develop 1 to 3 days after you come in contact with a cold virus. Symptoms vary from person to person. They may include:  Runny nose.  Sneezing.  Nasal congestion.  Sinus irritation.  Sore throat.  Loss of voice (laryngitis).  Cough.  Fatigue.  Muscle aches.  Loss of appetite.  Headache.  Low-grade fever. DIAGNOSIS  You might diagnose your own cold based on familiar symptoms, since most people get a cold 2 to 3 times a year. Your caregiver can confirm this based on your exam. Most importantly, your caregiver can check that your symptoms are not due to another disease such as strep throat, sinusitis, pneumonia, asthma, or epiglottitis. Blood tests, throat tests, and X-rays are not necessary to diagnose a common cold, but they may sometimes be helpful in excluding other more serious diseases. Your caregiver will decide if any further tests are required. RISKS AND COMPLICATIONS  You may be at risk for a more severe case of the common cold if you smoke cigarettes, have chronic heart disease (such as heart failure) or lung disease (such as asthma), or if  you have a weakened immune system. The very young and very old are also at risk for more serious infections. Bacterial sinusitis, middle ear infections, and bacterial pneumonia can complicate the common cold. The common cold can worsen asthma and chronic obstructive pulmonary disease (COPD). Sometimes, these complications can require emergency medical care and may be life-threatening. PREVENTION  The best way to protect against getting a cold is to practice good hygiene. Avoid oral or hand contact with people with cold symptoms. Wash your hands often if contact occurs. There is no clear evidence that vitamin C, vitamin E, echinacea, or exercise reduces the chance of developing a cold. However, it is always recommended to get plenty of rest and practice good nutrition. TREATMENT  Treatment is directed at relieving symptoms. There is no cure. Antibiotics are not effective, because the infection is caused by a virus, not by bacteria. Treatment may include:  Increased fluid intake. Sports drinks offer valuable electrolytes, sugars, and fluids.  Breathing heated mist or steam (vaporizer or shower).  Eating chicken soup or other clear broths, and maintaining good nutrition.  Getting plenty of rest.  Using gargles or lozenges for comfort.  Controlling fevers with ibuprofen or acetaminophen as directed by your caregiver.  Increasing usage of your inhaler if you have asthma. Zinc gel and zinc lozenges, taken in the first 24 hours of the common cold, can shorten the duration and lessen the severity of symptoms. Pain medicines may help with fever, muscle aches, and throat pain. A variety of non-prescription medicines are available to treat congestion and runny nose. Your caregiver   can make recommendations and may suggest nasal or lung inhalers for other symptoms.  HOME CARE INSTRUCTIONS   Only take over-the-counter or prescription medicines for pain, discomfort, or fever as directed by your  caregiver.  Use a warm mist humidifier or inhale steam from a shower to increase air moisture. This may keep secretions moist and make it easier to breathe.  Drink enough water and fluids to keep your urine clear or pale yellow.  Rest as needed.  Return to work when your temperature has returned to normal or as your caregiver advises. You may need to stay home longer to avoid infecting others. You can also use a face mask and careful hand washing to prevent spread of the virus. SEEK MEDICAL CARE IF:   After the first few days, you feel you are getting worse rather than better.  You need your caregiver's advice about medicines to control symptoms.  You develop chills, worsening shortness of breath, or brown or red sputum. These may be signs of pneumonia.  You develop yellow or brown nasal discharge or pain in the face, especially when you bend forward. These may be signs of sinusitis.  You develop a fever, swollen neck glands, pain with swallowing, or white areas in the back of your throat. These may be signs of strep throat. SEEK IMMEDIATE MEDICAL CARE IF:   You have a fever.  You develop severe or persistent headache, ear pain, sinus pain, or chest pain.  You develop wheezing, a prolonged cough, cough up blood, or have a change in your usual mucus (if you have chronic lung disease).  You develop sore muscles or a stiff neck. Document Released: 01/26/2001 Document Revised: 10/25/2011 Document Reviewed: 11/07/2013 [employer] Patient Information 2015 [employer]. This information is not intended to replace advice given to you by your health care provider. Make sure you discuss any questions you have with your health care provider.  

## 2014-08-16 LAB — HM PAP SMEAR

## 2015-02-22 ENCOUNTER — Emergency Department (HOSPITAL_BASED_OUTPATIENT_CLINIC_OR_DEPARTMENT_OTHER)
Admission: EM | Admit: 2015-02-22 | Discharge: 2015-02-22 | Disposition: A | Discharge status: Home / Self Care | Payer: 59 | Attending: Emergency Medicine | Admitting: Emergency Medicine

## 2015-02-22 ENCOUNTER — Encounter (HOSPITAL_BASED_OUTPATIENT_CLINIC_OR_DEPARTMENT_OTHER): Payer: Self-pay | Admitting: *Deleted

## 2015-02-22 DIAGNOSIS — Z79899 Other long term (current) drug therapy: Secondary | ICD-10-CM | POA: Diagnosis not present

## 2015-02-22 DIAGNOSIS — Z880 Allergy status to penicillin: Secondary | ICD-10-CM | POA: Insufficient documentation

## 2015-02-22 DIAGNOSIS — L309 Dermatitis, unspecified: Secondary | ICD-10-CM | POA: Insufficient documentation

## 2015-02-22 DIAGNOSIS — Z720 Tobacco use: Secondary | ICD-10-CM | POA: Insufficient documentation

## 2015-02-22 DIAGNOSIS — Z792 Long term (current) use of antibiotics: Secondary | ICD-10-CM | POA: Insufficient documentation

## 2015-02-22 DIAGNOSIS — R21 Rash and other nonspecific skin eruption: Secondary | ICD-10-CM | POA: Diagnosis present

## 2015-02-22 DIAGNOSIS — Z7952 Long term (current) use of systemic steroids: Secondary | ICD-10-CM | POA: Insufficient documentation

## 2015-02-22 NOTE — ED Provider Notes (Signed)
CSN: [id number]   Arrival date & time 02/22/15 2007  History  This chart was scribed for  Geoffery Lyonsouglas Orris Perin, MD by Bethel BornBritney McCollum, ED Scribe. This patient was seen in room MH10/MH10 and the patient's care was started at 8:33 PM.  Chief Complaint  Patient presents with  . Rash    HPI Patient is a 44 y.o. female presenting with rash. The history is provided by the patient and the spouse. No language interpreter was used.  Rash Location:  Leg Leg rash location:  L lower leg and R lower leg Quality: burning and painful   Pain details:    Quality:  Burning and throbbing   Severity:  Moderate   Onset quality:  Gradual   Duration:  24 months   Timing:  Intermittent   Progression:  Worsening Severity:  Moderate Onset quality:  Gradual Duration:  24 months Timing:  Intermittent Progression:  Worsening Chronicity:  Recurrent Relieved by:  Nothing Worsened by:  Nothing tried Ineffective treatments: Steroids, Ibuprofen. Associated symptoms: no fever    Makayla Bergererri R Reid is a 44 y.o. female who presents to the Emergency Department complaining of intermittent painful rash to the BLE with onset 2 years ago. 2 weeks ago the rash worsened and she started having pain and swelling in both legs. Pt notes that she came home from work 2 weeks ago with throbbing/stinging/burning pain that was relieved with ice. Ibuprofen provided insufficient pain relief.  At that time she saw her PCP and was told that she had "sun poisoning" and given prednisone. She has the rash at least twice a year and notes that when it is improving it turns purple. The episodes usually last for 3-4 days but she is concerned that they are worsening. She is very concerned for shingles.   History reviewed. No pertinent past medical history.  Past Surgical History  Procedure Laterality Date  . Cholecystectomy    . Tubal ligation    . Abdominal hysterectomy      No family history on file.  History  Substance Use Topics  . Smoking  status: Current Every Day Smoker -- 0.50 packs/day    Types: Cigarettes  . Smokeless tobacco: Never Used  . Alcohol Use: No     Comment: socially     Review of Systems  Constitutional: Negative for fever.  Skin: Positive for rash. Negative for itching.  All other systems reviewed and are negative.   Home Medications   Prior to Admission medications   Medication Sig Start Date End Date Taking? Authorizing Provider  ALPRAZolam Prudy Feeler(XANAX) 1 MG tablet Take 1 mg by mouth 3 (three) times daily as needed for anxiety or sleep.    Yes Historical Provider, MD  Multiple Vitamin (MULTIVITAMIN) capsule Take 1 capsule by mouth daily.   Yes Historical Provider, MD  vitamin C (ASCORBIC ACID) 500 MG tablet Take 500 mg by mouth daily.   Yes Historical Provider, MD  albuterol (PROVENTIL HFA;VENTOLIN HFA) 108 (90 BASE) MCG/ACT inhaler Inhale 2 puffs into the lungs every 4 (four) hours as needed for wheezing or shortness of breath. 04/24/14   Kristen N Ward, DO  doxycycline (VIBRAMYCIN) 100 MG capsule Take 1 capsule (100 mg total) by mouth 2 (two) times daily. 04/24/14   Kristen N Ward, DO  predniSONE (DELTASONE) 20 MG tablet Take 3 tablets (60 mg total) by mouth daily. 04/24/14   Kristen N Ward, DO    Allergies  Penicillins  Triage Vitals: BP 128/94 mmHg  Pulse 89  Temp(Src) 98.9 F (37.2 C) (Oral)  Resp 20  Ht  (1.626 m)  Wt 107 lb (48.535 kg)  BMI 18.36 kg/m2  SpO2 98%  LMP 04/02/2014  Physical Exam  Constitutional: She is oriented to person, place, and time and well-developed, well-nourished, and in no distress. No distress.  HENT:  Head: Normocephalic and atraumatic.  Neck: Normal range of motion. Neck supple.  Musculoskeletal: Normal range of motion. She exhibits no edema.  Neurological: She is alert and oriented to person, place, and time.  Skin: Skin is warm and dry. She is not diaphoretic.  There is an erythematous, non-blanching rash to the medial aspect of both lower legs.   Nursing  note and vitals reviewed.   ED Course  Procedures   DIAGNOSTIC STUDIES: Oxygen Saturation is 98% on RA, normal by my interpretation.    COORDINATION OF CARE: 8:41 PM Discussed treatment plan which includes dermatology f/u with pt at bedside and pt agreed to plan.  Labs Review- Labs Reviewed - No data to display  Imaging Review No results found.  EKG Interpretation None      MDM   Final diagnoses:  None     Patient presents with complaints of burning rash to both lower extremities. This has been recurring for the past 4 years. She is quite adamant it is shingles, however I am certain that it is not. I am uncertain as to what is causing this, however nothing appears emergent. My advice to her is to follow-up with a dermatologist who can obtain a skin biopsy to determine the etiology.   I personally performed the services described in this documentation, which was scribed in my presence. The recorded information has been reviewed and is accurate.     Geoffery Lyons, MD 02/25/15 [phone]

## 2015-02-22 NOTE — ED Notes (Signed)
Fflat, dry, splotchy redness noted to bilateral medial lower legs (between calf and shin, above ankles, half way up to knee, describes as stinging & burning pain, pain goes up to bilateral groin, (denies itching), mentions recent blisters, none noted, no topicals applied, seen before for the same, has taken prednisone for same, was treated by PCP as "sun poisoning". Reports started on R, then developed on L. Ongoing for ~ 1 year. Pt states, "I think it is shingles".

## 2015-02-22 NOTE — Discharge Instructions (Signed)

## 2015-02-22 NOTE — ED Notes (Addendum)
Rash to BLE since last night- states she had the same rash 2 weeks ago and was treated with prednisone and SSD- pt ?shingles- used SSD cream last night which made sx worse

## 2015-12-17 ENCOUNTER — Other Ambulatory Visit: Payer: Self-pay | Admitting: Otolaryngology

## 2016-03-10 ENCOUNTER — Emergency Department (HOSPITAL_BASED_OUTPATIENT_CLINIC_OR_DEPARTMENT_OTHER): Payer: BLUE CROSS/BLUE SHIELD

## 2016-03-10 ENCOUNTER — Encounter (HOSPITAL_BASED_OUTPATIENT_CLINIC_OR_DEPARTMENT_OTHER): Payer: Self-pay

## 2016-03-10 ENCOUNTER — Emergency Department (HOSPITAL_BASED_OUTPATIENT_CLINIC_OR_DEPARTMENT_OTHER)
Admission: EM | Admit: 2016-03-10 | Discharge: 2016-03-10 | Disposition: A | Discharge status: Home / Self Care | Payer: BLUE CROSS/BLUE SHIELD | Attending: Emergency Medicine | Admitting: Emergency Medicine

## 2016-03-10 DIAGNOSIS — R0781 Pleurodynia: Secondary | ICD-10-CM | POA: Diagnosis not present

## 2016-03-10 DIAGNOSIS — R6883 Chills (without fever): Secondary | ICD-10-CM | POA: Diagnosis present

## 2016-03-10 DIAGNOSIS — R5383 Other fatigue: Secondary | ICD-10-CM

## 2016-03-10 DIAGNOSIS — R05 Cough: Secondary | ICD-10-CM | POA: Diagnosis not present

## 2016-03-10 DIAGNOSIS — R059 Cough, unspecified: Secondary | ICD-10-CM

## 2016-03-10 DIAGNOSIS — F17210 Nicotine dependence, cigarettes, uncomplicated: Secondary | ICD-10-CM | POA: Insufficient documentation

## 2016-03-10 LAB — CBC WITH DIFFERENTIAL/PLATELET
BASOS PCT: 0 %
Basophils Absolute: 0 10*3/uL (ref 0.0–0.1)
EOS ABS: 0.3 10*3/uL (ref 0.0–0.7)
Eosinophils Relative: 5 %
HEMATOCRIT: 40.9 % (ref 36.0–46.0)
Hemoglobin: 13.7 g/dL (ref 12.0–15.0)
LYMPHS ABS: 2.4 10*3/uL (ref 0.7–4.0)
Lymphocytes Relative: 38 %
MCH: 31.9 pg (ref 26.0–34.0)
MCHC: 33.5 g/dL (ref 30.0–36.0)
MCV: 95.3 fL (ref 78.0–100.0)
MONO ABS: 0.4 10*3/uL (ref 0.1–1.0)
MONOS PCT: 7 %
NEUTROS ABS: 3.1 10*3/uL (ref 1.7–7.7)
Neutrophils Relative %: 50 %
Platelets: 182 10*3/uL (ref 150–400)
RBC: 4.29 MIL/uL (ref 3.87–5.11)
RDW: 11.7 % (ref 11.5–15.5)
WBC: 6.2 10*3/uL (ref 4.0–10.5)

## 2016-03-10 LAB — URINALYSIS, ROUTINE W REFLEX MICROSCOPIC
BILIRUBIN URINE: NEGATIVE
Glucose, UA: NEGATIVE mg/dL
HGB URINE DIPSTICK: NEGATIVE
KETONES UR: NEGATIVE mg/dL
Leukocytes, UA: NEGATIVE
NITRITE: NEGATIVE
PH: 7 (ref 5.0–8.0)
Protein, ur: NEGATIVE mg/dL
SPECIFIC GRAVITY, URINE: 1.016 (ref 1.005–1.030)

## 2016-03-10 LAB — BASIC METABOLIC PANEL
Anion gap: 5 (ref 5–15)
BUN: 11 mg/dL (ref 6–20)
CALCIUM: 9.2 mg/dL (ref 8.9–10.3)
CHLORIDE: 105 mmol/L (ref 101–111)
CO2: 29 mmol/L (ref 22–32)
CREATININE: 0.76 mg/dL (ref 0.44–1.00)
GFR calc Af Amer: >60 mL/min (ref >60)
GFR calc non Af Amer: >60 mL/min (ref >60)
GLUCOSE: 87 mg/dL (ref 65–99)
Potassium: 4.2 mmol/L (ref 3.5–5.1)
Sodium: 139 mmol/L (ref 135–145)

## 2016-03-10 NOTE — ED Provider Notes (Signed)
[facility] Provider Note   CSN: [id number] Arrival date & time: 03/10/16  1824   By signing my name below, I, Phillis Haggis, attest that this documentation has been prepared under the direction and in the presence of Rolan Bucco, MD. Electronically Signed: Phillis Haggis, ED Scribe. 03/10/16. 7:09 PM.  First Provider Contact:  None       History   Chief Complaint Chief Complaint  Patient presents with  . Chills    The history is provided by the patient. No language interpreter was used.   HPI Comments: Makayla Reid is a 45 y.o. Female  who presents to the Emergency Department complaining of chills onset 2 months ago. Pt reports associated intermittent pain to the left ribs, intermittent SOB, productive cough with thick, yellow sputum, and generalized fatigue. She has been feeling bad since having a throat surgery and biopsy on May 5th. The biopsy was negative for cancer but pt was then diagnosed with thrush. She was prescribed antibiotics but states that it made her feel worse. She believes that she may have pneumonia. Pt states that her PCP is leaving and is currently looking for another PCP. She denies fever, trouble swallowing, diarrhea, vomiting, or dysuria.   History reviewed. No pertinent past medical history.  Patient Active Problem List   Diagnosis Date Noted  . HA (headache) 05/19/2013  . Viral syndrome 05/19/2013    Past Surgical History:  Procedure Laterality Date  . ABDOMINAL HYSTERECTOMY    . CHOLECYSTECTOMY    . TUBAL LIGATION      OB History    No data available       Home Medications    Prior to Admission medications   Medication Sig Start Date End Date Taking? Authorizing Provider  albuterol (PROVENTIL HFA;VENTOLIN HFA) 108 (90 BASE) MCG/ACT inhaler Inhale 2 puffs into the lungs every 4 (four) hours as needed for wheezing or shortness of breath. 04/24/14   Kristen N Ward, DO  ALPRAZolam (XANAX) 1 MG tablet Take 1 mg by mouth 3  (three) times daily as needed for anxiety or sleep.     Historical Provider, MD  Multiple Vitamin (MULTIVITAMIN) capsule Take 1 capsule by mouth daily.    Historical Provider, MD  vitamin C (ASCORBIC ACID) 500 MG tablet Take 500 mg by mouth daily.    Historical Provider, MD    Family History No family history on file.  Social History Social History  Substance Use Topics  . Smoking status: Current Every Day Smoker    Packs/day: 0.50    Types: Cigarettes  . Smokeless tobacco: Never Used  . Alcohol use Yes     Comment: socially     Allergies   Penicillins and Zithromax [azithromycin]   Review of Systems Review of Systems  Constitutional: Positive for chills and fatigue. Negative for diaphoresis and fever.  HENT: Negative for congestion, rhinorrhea, sneezing and trouble swallowing.   Eyes: Negative.   Respiratory: Positive for cough and shortness of breath. Negative for chest tightness.   Cardiovascular: Positive for chest pain (left rib pain). Negative for leg swelling.  Gastrointestinal: Negative for abdominal pain, blood in stool, diarrhea, nausea and vomiting.  Genitourinary: Negative for difficulty urinating, dysuria, flank pain, frequency and hematuria.  Musculoskeletal: Negative for arthralgias and back pain.  Skin: Negative for rash.  Neurological: Negative for dizziness, speech difficulty, weakness, numbness and headaches.     Physical Exam Updated Vital Signs BP 96/76 (BP Location: Right Arm)   Pulse (!) 57  Temp 98.9 F (37.2 C) (Oral)   Resp 18   Ht  (1.626 m)   Wt 106 lb (48.1 kg)   LMP 04/02/2014 Comment: tubal ligation  SpO2 99%   BMI 18.19 kg/m   Physical Exam  Constitutional: She is oriented to person, place, and time. She appears well-developed and well-nourished.  HENT:  Head: Normocephalic and atraumatic.  Mouth/Throat: No posterior oropharyngeal erythema.  No evidence of thrush  Eyes: Pupils are equal, round, and reactive to light.    Neck: Normal range of motion. Neck supple.  Cardiovascular: Normal rate, regular rhythm and normal heart sounds.   Pulmonary/Chest: Effort normal and breath sounds normal. No respiratory distress. She has no wheezes. She has no rales. She exhibits no tenderness.  No pain along the ribs  Abdominal: Soft. Bowel sounds are normal. There is no tenderness. There is no rebound and no guarding.  Musculoskeletal: Normal range of motion. She exhibits no edema.  No calf tenderness  Lymphadenopathy:    She has no cervical adenopathy.  Neurological: She is alert and oriented to person, place, and time.  Skin: Skin is warm and dry. No rash noted.  Psychiatric: She has a normal mood and affect.     ED Treatments / Results  DIAGNOSTIC STUDIES: Oxygen Saturation is 98% on RA, normal by my interpretation.    COORDINATION OF CARE: 7:06 PM-Discussed treatment plan which includes labs and chest x-raywith pt at bedside and pt agreed to plan.    Labs (all labs ordered are listed, but only abnormal results are displayed) Labs Reviewed  URINALYSIS, ROUTINE W REFLEX MICROSCOPIC (NOT AT [facility]) - Abnormal; Notable for the following:       Result Value   APPearance CLOUDY (*)    All other components within normal limits  BASIC METABOLIC PANEL  CBC WITH DIFFERENTIAL/PLATELET    EKG  EKG Interpretation None       Radiology Dg Chest 2 View  Result Date: 03/10/2016 CLINICAL DATA:  Intermittent shortness of breath with productive cough EXAM: CHEST  2 VIEW COMPARISON:  04/24/2014 FINDINGS: The heart size and mediastinal contours are within normal limits. Both lungs are hyperinflated. The visualized skeletal structures are unremarkable. IMPRESSION: COPD without acute abnormality. Electronically Signed   By: Alcide Clever M.D.   On: 03/10/2016 20:07   Procedures Procedures (including critical care time)  Medications Ordered in ED Medications - No data to display   Initial Impression / Assessment  and Plan / ED Course  I have reviewed the triage vital signs and the nursing notes.  Pertinent labs & imaging results that were available during my care of the patient were reviewed by me and considered in my medical decision making (see chart for details).  Clinical Course    Patient presents with fatigue this been going on about 2-3 months. She also has a cough. There is no persistent chest pain or shortness of breath that would be more concerning for pulmonary embolus. Her labs are unremarkable. There is no anemia. There is no evidence of a UTI. Her other labs are reassuring. She was discharged home in good condition. I did advise her that she has COPD changes on her x-ray. I encouraged her to stop smoking. I gave her a referral to primary care here at [facility] as she is looking for a new primary care physician. I encouraged her to have close follow-up with a PCP or return here as needed for any worsening symptoms.  Final Clinical Impressions(s) /  ED Diagnoses   Final diagnoses:  Other fatigue  Cough  I personally performed the services described in this documentation, which was scribed in my presence.  The recorded information has been reviewed and considered.    New Prescriptions New Prescriptions   No medications on file     Rolan Bucco, MD 03/10/16 2028

## 2016-03-10 NOTE — ED Triage Notes (Signed)
C/o chills, "not feeling well" since May-had throat surgery with biopsy to r/o cancer-was neg-was then treated for 10 days abx for thrush-c/o pain to left rib-denies injury-NAD-steady gait

## 2016-03-10 NOTE — ED Notes (Signed)
MD at bedside. 

## 2016-03-29 ENCOUNTER — Telehealth: Payer: Self-pay | Admitting: Behavioral Health

## 2016-03-29 ENCOUNTER — Encounter: Payer: Self-pay | Admitting: Behavioral Health

## 2016-03-29 NOTE — Telephone Encounter (Signed)
Pre-Visit Call completed with patient and chart updated.   Pre-Visit Info documented in Specialty Comments under SnapShot.    

## 2016-03-31 ENCOUNTER — Encounter: Payer: Self-pay | Admitting: Family Medicine

## 2016-03-31 ENCOUNTER — Ambulatory Visit (INDEPENDENT_AMBULATORY_CARE_PROVIDER_SITE_OTHER): Payer: BLUE CROSS/BLUE SHIELD | Admitting: Family Medicine

## 2016-03-31 VITALS — BP 112/74 | HR 66 | Temp 98.3°F | Ht 64.0 in | Wt 111.4 lb

## 2016-03-31 DIAGNOSIS — Z7689 Persons encountering health services in other specified circumstances: Secondary | ICD-10-CM

## 2016-03-31 DIAGNOSIS — R634 Abnormal weight loss: Secondary | ICD-10-CM

## 2016-03-31 DIAGNOSIS — J449 Chronic obstructive pulmonary disease, unspecified: Secondary | ICD-10-CM

## 2016-03-31 DIAGNOSIS — R0781 Pleurodynia: Secondary | ICD-10-CM

## 2016-03-31 DIAGNOSIS — F329 Major depressive disorder, single episode, unspecified: Secondary | ICD-10-CM | POA: Diagnosis not present

## 2016-03-31 DIAGNOSIS — Z716 Tobacco abuse counseling: Secondary | ICD-10-CM | POA: Diagnosis not present

## 2016-03-31 DIAGNOSIS — Z7189 Other specified counseling: Secondary | ICD-10-CM

## 2016-03-31 DIAGNOSIS — F32A Depression, unspecified: Secondary | ICD-10-CM

## 2016-03-31 MED ORDER — BUPROPION HCL ER (SR) 150 MG PO TB12: 150.0000 mg | ORAL_TABLET | Freq: Two times a day (BID) | ORAL | 4 refills | Status: DC

## 2016-03-31 MED ORDER — MELOXICAM 7.5 MG PO TABS: 7.5000 mg | ORAL_TABLET | Freq: Every day | ORAL | 0 refills | Status: DC

## 2016-03-31 NOTE — Patient Instructions (Signed)
It was very nice to meet you today!   Please make a follow- up appt to see me in 4-6 weeks We will start wellbutin for depression and also to aid in quitting smoking.  Take it once a day for 3 days, then increase to twice a day I hope that this will help decrease your depression symptoms over the next few weeks- let me know if this is getting worse!  We can try another medication if needed You can also improve your depression symptoms by - spending time doing things your enjoy, getting outdoors, helping others, physical exercise  Remember the wellbutrin can decrease your appetite so keep eating to support your weight!   For your left rib pain, try taking the mobic once a day for about 2 weeks.  If this does not stop your pain, please let me know and we can think about a CT scan.

## 2016-03-31 NOTE — Progress Notes (Signed)
Pre visit review using our clinic review tool, if applicable. No additional management support is needed unless otherwise documented below in the visit note. 

## 2016-03-31 NOTE — Progress Notes (Signed)
[facility] [address], [address] [place], [place] [address] [phone] [phone]  Date:  03/31/2016   Name:  Makayla Reid   DOB:  10/15/1970   MRN:  [id number]  PCP:  Abbe AmsterdamOPLAND,Navarre Diana, MD    Chief Complaint: Establish Care (c/o feeling weak and fatigued since end of April. Pt states that she has been dx with COPD but it does not show up in her health history. Pt is having left sided rib pain that comes and goes since April. Would discuss sx's of depression pt has never been on meds for depression before. PHQ-9 total score: 18.)   History of Present Illness:  Makayla Reid is a 45 y.o. very pleasant female patient who presents with the following:  Here today as a new patient- no recent primary care notes in system.  She has been traveling out of town for her PCP care, but her doctor plans to retire soon so she would like to start seeing me. .  She does not have an OBG at his time   Recently, she got sick in April and had to have throat surgery to remove edema of her vocal folds.  Operation done by Dr. Emeline DarlingGore with ENT specialists in [place].  She did NOT have cancer. At this time she notes that her voice is still in an out.  She did try using nictoine patches, but they seemed to be too strong to her and made her arm red She is smoking 2-3 cigs a day now.  Cut down from a max of 1- 1.5PPD.  She wants to quit totally  She has been told that she had COPD in the past- she did a breathing test (spirometry) which was positive per her report.   She will sometimes feel SOB, but this is mostly at night, not with activity.  She did have a peri stent cough last year, but this is now resolved.  She does not have a cough at this time  She is also concerned about depression; her husband has been concerned about this.  She has noted depression sx off an on since she got sick in April. 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  She notes that she feels like sleeping a lot, she does not have the same interest in things that she used to enjoy.  She does not feel like getting out and doing things as much as is typical for her She feels like she sleeps too much She will have crying spells.   She has had episodic depression in the past - however never needed to be treated for this in the past.  Shed denies any SI  She has not lost weight- she has actually gained a few lbs which she is happy about.  She got under 90 lbs per her report when she had her neck surgery  Her mother died when she was 45 yo, and she is not in much contact with her father. She does not know much about her family history  She is married and hs a 47 yo son, 2 grands.  They do live locally.   No history of seizure disorder S/p hysterectomy  Wt Readings from Last 3 Encounters:  03/31/16 111 lb 6.4 oz (50.5 kg)  03/10/16 106 lb (48.1 kg)  02/22/15 107 lb (  48.5 kg)   She started smoking about 17 years ago  She also notes pain in her left ribs that has been present since April. May of this year. She did have a plain CXR last month that was normal.  No known trauma, no hemoptysis   Patient Active Problem List   Diagnosis Date Noted  . HA (headache) 05/19/2013  . Viral syndrome 05/19/2013    Past Medical History:  Diagnosis Date  . Reinke's edema of vocal folds     Past Surgical History:  Procedure Laterality Date  . ABDOMINAL HYSTERECTOMY    . CHOLECYSTECTOMY    . MICROLARYNGOSCOPY    . TUBAL LIGATION      Social History  Substance Use Topics  . Smoking status: Current Every Day Smoker    Packs/day: 0.50    Types: Cigarettes  . Smokeless tobacco: Never Used  . Alcohol use Yes     Comment: socially    No family history on file.  Allergies  Allergen Reactions  . Zithromax [Azithromycin] Other (See Comments)    "high fever"  . Penicillins Rash    Medication  list has been reviewed and updated.  Current Outpatient Prescriptions on File Prior to Visit  Medication Sig Dispense Refill  . ALPRAZolam (XANAX) 1 MG tablet Take 1 mg by mouth 3 (three) times daily as needed for anxiety or sleep.     Marland Kitchen ibuprofen (ADVIL,MOTRIN) 800 MG tablet Take by mouth.    . Multiple Vitamin (MULTIVITAMIN) capsule Take 1 capsule by mouth daily.    . Omega-3 1000 MG CAPS Take 1 g by mouth.     No current facility-administered medications on file prior to visit.     Review of Systems:  As per HPI- otherwise negative. No fever, chills, nausea, vomiting, current rash- she did have a rash in the spring. Photos on her phone look like vasculitis No menses as she is s/p hysterectomy  No wheezing   Physical Examination: Vitals:   03/31/16 1004  BP: 112/74  Pulse: 66  Temp: 98.3 F (36.8 C)   Vitals:   03/31/16 1004  Weight: 111 lb 6.4 oz (50.5 kg)  Height: 5\' 4"  (1.626 m)   Body mass index is 19.12 kg/m. Ideal Body Weight: Weight in (lb) to have BMI = 25: 145.3  GEN: WDWN, NAD, Non-toxic, A & O x 3, looks well, slim build.  Very tan HEENT: Atraumatic, Normocephalic. Neck supple. No masses, No LAD. Ears and Nose: No external deformity. CV: RRR, No M/G/R. No JVD. No thrill. No extra heart sounds. PULM: CTA B, no wheezes, crackles, rhonchi. No retractions. No resp. distress. No accessory muscle use. ABD: S, NT, ND. No rebound. No HSM. EXTR: No c/c/e NEURO Normal gait.  PSYCH: Normally interactive. Conversant. Not depressed or anxious appearing.  Calm demeanor.  Left ribs: she has mild tenderness at the left lateral inferior ribs- no redness, rash or lesion noted   Assessment and Plan: Depression - Plan: buPROPion (WELLBUTRIN SR) 150 MG 12 hr tablet  Encounter for tobacco use cessation counseling - Plan: buPROPion (WELLBUTRIN SR) 150 MG 12 hr tablet  Chronic obstructive pulmonary disease, unspecified COPD type (HCC)  Encounter to establish care  Loss  of weight  Rib pain on left side - Plan: meloxicam (MOBIC) 7.5 MG tablet here today with a few concerns- plan as below:   It was very nice to meet you today!   Please make a follow- up appt to see me in 4-6  weeks We will start wellbutin for depression and also to aid in quitting smoking.  Take it once a day for 3 days, then increase to twice a day I hope that this will help decrease your depression symptoms over the next few weeks- let me know if this is getting worse!  We can try another medication if needed You can also improve your depression symptoms by - spending time doing things your enjoy, getting outdoors, helping others, physical exercise  Remember the wellbutrin can decrease your appetite so keep eating to support your weight!   For your left rib pain, try taking the mobic once a day for about 2 weeks.  If this does not stop your pain, please let me know and we can think about a CT scan.    Signed Abbe AmsterdamJessica Kourtnei Rauber, MD

## 2016-04-13 ENCOUNTER — Telehealth: Payer: Self-pay | Admitting: Family Medicine

## 2016-04-13 NOTE — Telephone Encounter (Signed)
°  Relation to WG:NFAOpt:self Call back number:[phone] [facility] [phone] (Phone) [phone] (Fax)      Reason for call:  Patient states buPROPion [facility](WELLBUTRIN SR) 150 MG 12 hr tablet is causing patient to have mood swings requesting a new medication.

## 2016-04-13 NOTE — Telephone Encounter (Signed)
Called her to get more details. We started wellbutrin just about 2 weeks ago for depression.  Pt states that she does feel less depressed, has more energy and feels like getting up and out of the house.  However for the last 2 days she has felt more irritable with her husband and is occasionally tearful.  She thinks this started when she increased her wellbutrin to BID.  However she denies any SI or HI.  Offered to have her stick with the wellbutrin a bit longer- may go back to one a day if she likes, vs changing to a new med.  She would like to stick with the wellbutrin but is encouraged to keep me closely apprised of her progress and symptoms, and to report any concerns that she has to me

## 2016-05-19 ENCOUNTER — Ambulatory Visit (HOSPITAL_BASED_OUTPATIENT_CLINIC_OR_DEPARTMENT_OTHER)
Admission: RE | Admit: 2016-05-19 | Discharge: 2016-05-19 | Disposition: A | Discharge status: Home / Self Care | Payer: BLUE CROSS/BLUE SHIELD | Source: Ambulatory Visit | Attending: Family Medicine | Admitting: Family Medicine

## 2016-05-19 ENCOUNTER — Encounter: Payer: Self-pay | Admitting: Family Medicine

## 2016-05-19 ENCOUNTER — Ambulatory Visit (INDEPENDENT_AMBULATORY_CARE_PROVIDER_SITE_OTHER): Payer: BLUE CROSS/BLUE SHIELD | Admitting: Family Medicine

## 2016-05-19 VITALS — BP 126/83 | HR 67 | Temp 98.5°F | Ht 64.0 in | Wt 115.0 lb

## 2016-05-19 DIAGNOSIS — F320 Major depressive disorder, single episode, mild: Secondary | ICD-10-CM

## 2016-05-19 DIAGNOSIS — J208 Acute bronchitis due to other specified organisms: Secondary | ICD-10-CM | POA: Diagnosis not present

## 2016-05-19 DIAGNOSIS — R509 Fever, unspecified: Secondary | ICD-10-CM

## 2016-05-19 DIAGNOSIS — R05 Cough: Secondary | ICD-10-CM

## 2016-05-19 DIAGNOSIS — J449 Chronic obstructive pulmonary disease, unspecified: Secondary | ICD-10-CM

## 2016-05-19 DIAGNOSIS — R059 Cough, unspecified: Secondary | ICD-10-CM

## 2016-05-19 DIAGNOSIS — R635 Abnormal weight gain: Secondary | ICD-10-CM

## 2016-05-19 MED ORDER — DOXYCYCLINE HYCLATE 100 MG PO CAPS: 100.0000 mg | ORAL_CAPSULE | Freq: Two times a day (BID) | ORAL | 0 refills | Status: DC

## 2016-05-19 MED ORDER — FLUOXETINE HCL 20 MG PO TABS: 20.0000 mg | ORAL_TABLET | Freq: Every day | ORAL | 5 refills | Status: DC

## 2016-05-19 MED ORDER — PREDNISONE 20 MG PO TABS: ORAL_TABLET | ORAL | 0 refills | Status: DC

## 2016-05-19 NOTE — Progress Notes (Signed)
Pre visit review using our clinic review tool, if applicable. No additional management support is needed unless otherwise documented below in the visit note. 

## 2016-05-19 NOTE — Patient Instructions (Signed)
Your chest x-ray did not show pneumonia- we are going to treat you for bronchitis and sinusitis with doxycycline. Take this twice a day for 10 days with food and water.  Also, use the prednisone for 6 days as directed.  Your inhaler can be used for wheezing  For depression we are going to try prozac- take this once a day.  Wait until you finish the prednisone to start this medication however- please contact me with an update in about one month to let me know how this is working for you

## 2016-05-19 NOTE — Progress Notes (Signed)
[facility] [address], [address] [place], [place] [address] [phone] [phone]  Date:  05/19/2016   Name:  Makayla Reid   DOB:  12/29/1970   MRN:  [id number]  PCP:  Abbe AmsterdamOPLAND,JESSICA, MD    Chief Complaint: Cough (c/o prod cough with clear mucus, headache and feeling run down x 2 weeks. Pt states that at home she has had a fever off and on. Pt wanted to info that stopped Bupropion about 3 weeks about due to feeling like the medication was making her forget things. )   History of Present Illness:  Makayla Reid is a 45 y.o. very pleasant female patient who presents with the following:  Seen by myself in August with the following partial HPI:  Recently, she got sick in April and had to have throat surgery to remove edema of her vocal folds.  Operation done by Dr. Emeline DarlingGore with ENT specialists in [place].  She did NOT have cancer. At this time she notes that her voice is still in an out.  She did try using nictoine patches, but they seemed to be too strong to her and made her arm red She is smoking 2-3 cigs a day now.  Cut down from a max of 1- 1.5PPD.  She wants to quit totally  She has been told that she had COPD in the past- she did a breathing test (spirometry) which was positive per her report.   She will sometimes feel SOB, but this is mostly at night, not with activity.  She did have a peri stent cough last year, but this is now resolved.  She does not have a cough at this time  She is also concerned about depression; her husband has been concerned about this.  She has noted depression sx off an on since she got sick in April. 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  She notes that she feels like sleeping a lot, she does not have the same interest in things that she used to enjoy.  She does not feel like getting out and doing things as much as  is typical for her She feels like she sleeps too much She will have crying spells.   She has had episodic depression in the past - however never needed to be treated for this in the past.  Shed denies any SI  Here today with complaint of illness.  She has been sick for about 2 weeks with sx of aches and fatigue, cough. Her cough has continued.  She has used allergy meds, dayquil, nyquil, but her sx persisted so she decided to come in to be seen She has had upper respiratory infection and bronchitis, and also pneumonia in the past. She notes sinus pressure and pain, her eyes appear puffy.  She has noted chills, will feel hot.-  She had a tmax of 101.  Last temp was yesterday No GI symptoms no nausea, vomiting or diarrhea No rash She is coughing up some clear material She feel very congested which can impede her breathing. She does have an inhaler at home but does not typically use it because it burns her throat  She felt like the wellbutrin was causing memory issues and concentration issues so she stopped the mediation.  She would like to try a different medication for  this. She denies any SI- "my depression is due to family situation, I would like to try another medication to help with it."      Patient Active Problem List   Diagnosis Date Noted  . Depression 03/31/2016  . Chronic obstructive pulmonary disease (HCC) 03/31/2016  . HA (headache) 05/19/2013  . Viral syndrome 05/19/2013    Past Medical History:  Diagnosis Date  . Reinke's edema of vocal folds     Past Surgical History:  Procedure Laterality Date  . ABDOMINAL HYSTERECTOMY    . CHOLECYSTECTOMY    . MICROLARYNGOSCOPY    . TUBAL LIGATION      Social History  Substance Use Topics  . Smoking status: Current Every Day Smoker    Packs/day: 0.50    Types: Cigarettes  . Smokeless tobacco: Never Used  . Alcohol use Yes     Comment: socially    No family history on file.  Allergies  Allergen Reactions  . Zithromax  [Azithromycin] Other (See Comments)    "high fever"  . Penicillins Rash    Medication list has been reviewed and updated.  Current Outpatient Prescriptions on File Prior to Visit  Medication Sig Dispense Refill  . ALPRAZolam (XANAX) 1 MG tablet Take 1 mg by mouth 3 (three) times daily as needed for anxiety or sleep.     Marland Kitchen ibuprofen (ADVIL,MOTRIN) 800 MG tablet Take by mouth.    . meloxicam (MOBIC) 7.5 MG tablet Take 1 tablet (7.5 mg total) by mouth daily. 30 tablet 0  . Multiple Vitamin (MULTIVITAMIN) capsule Take 1 capsule by mouth daily.    . Omega-3 1000 MG CAPS Take 1 g by mouth.     No current facility-administered medications on file prior to visit.     Review of Systems:  As per HPI- otherwise negative.   Physical Examination: Vitals:   05/19/16 0915  BP: 126/83  Pulse: 67  Temp: 98.5 F (36.9 C)   Vitals:   05/19/16 0915  Weight: 115 lb (52.2 kg)  Height: 5\' 4"  (1.626 m)   Body mass index is 19.74 kg/m. Ideal Body Weight: Weight in (lb) to have BMI = 25: 145.3   Wt Readings from Last 3 Encounters:  05/19/16 115 lb (52.2 kg)  03/31/16 111 lb 6.4 oz (50.5 kg)  03/10/16 106 lb (48.1 kg)     GEN: WDWN, NAD, Non-toxic, A & O x 3, thin build, smoker.  She has gained a few lbs back which is great HEENT: Atraumatic, Normocephalic. Neck supple. No masses, No LAD.  Bilateral TM wnl, oropharynx normal.  PEERL,EOMI.   Ears and Nose: No external deformity. CV: RRR, No M/G/R. No JVD. No thrill. No extra heart sounds. PULM:  No retractions. No resp. distress. No accessory muscle use.  Faint wheeze on left and rinchi right base- sent for CXR ABD: S, NT, ND. No rebound. No HSM. EXTR: No c/c/e NEURO Normal gait.  PSYCH: Normally interactive. Conversant. Not depressed or anxious appearing.  Calm demeanor.   Dg Chest 2 View  Result Date: 05/19/2016 CLINICAL DATA:  Cough for 2 weeks.  Fever for 1 day EXAM: CHEST  2 VIEW COMPARISON:  March 10, 2016 FINDINGS: Lungs appear  hyperexpanded. No edema or consolidation. Heart size and pulmonary vascularity are normal. No adenopathy. No bone lesions. IMPRESSION: No edema or consolidation. Lungs hyperexpanded. Stable cardiac silhouette. Electronically Signed   By: Bretta Bang III M.D.   On: 05/19/2016 09:44    Assessment and Plan: Acute  bronchitis due to other specified organisms - Plan: doxycycline (VIBRAMYCIN) 100 MG capsule, predniSONE (DELTASONE) 20 MG tablet  Cough - Plan: DG Chest 2 View  Fever, unknown origin - Plan: DG Chest 2 View  Chronic obstructive pulmonary disease, unspecified COPD type (HCC) - Plan: predniSONE (DELTASONE) 20 MG tablet  Mild single current episode of major depressive disorder (HCC) - Plan: FLUoxetine (PROZAC) 20 MG tablet  Weight gain  Here today with bronchitis/ COPD exacerbation. Treat with abx, prednisone and inhaled albuterol. Asked her to let me know if not feeling better in the next few days- Sooner if worse.  Will try prozac for depression- she will start this once over acute illness She has gained back some weight and is pleased! She will plan to see me for a physical and to discuss the prozac within about one month   Signed Abbe Amsterdam, MD

## 2016-05-26 ENCOUNTER — Telehealth: Payer: Self-pay | Admitting: Family Medicine

## 2016-05-26 MED ORDER — AZITHROMYCIN 250 MG PO TABS: ORAL_TABLET | ORAL | 0 refills | Status: DC

## 2016-05-26 NOTE — Telephone Encounter (Signed)
Called her back- she notes that she is still ill and seems to be taking too long to get better.  She notes headaches, drainage, cough.  She has azithornmycin listed as an allergy but she states she is not actually allergic to this medication in any way- will remove from her list.  She is quite convinced that a "zpack will help me knock this out."  This is fine, will rx for her and asked her to let me know if not better soon.  She is not having any fever

## 2016-05-26 NOTE — Telephone Encounter (Signed)
Patient states that she has finished her antibiotics that were prescribed and she still doesn't feel better. She would like to get a Z-PAC prescribed to her. Please advise.   Patient phone: [phone] [facility]

## 2016-07-13 ENCOUNTER — Emergency Department (HOSPITAL_BASED_OUTPATIENT_CLINIC_OR_DEPARTMENT_OTHER)
Admission: EM | Admit: 2016-07-13 | Discharge: 2016-07-13 | Disposition: A | Discharge status: Home / Self Care | Payer: BLUE CROSS/BLUE SHIELD | Attending: Emergency Medicine | Admitting: Emergency Medicine

## 2016-07-13 ENCOUNTER — Ambulatory Visit (INDEPENDENT_AMBULATORY_CARE_PROVIDER_SITE_OTHER): Payer: BLUE CROSS/BLUE SHIELD | Admitting: Family Medicine

## 2016-07-13 ENCOUNTER — Encounter (HOSPITAL_BASED_OUTPATIENT_CLINIC_OR_DEPARTMENT_OTHER): Payer: Self-pay | Admitting: Emergency Medicine

## 2016-07-13 ENCOUNTER — Emergency Department (HOSPITAL_BASED_OUTPATIENT_CLINIC_OR_DEPARTMENT_OTHER): Payer: BLUE CROSS/BLUE SHIELD

## 2016-07-13 ENCOUNTER — Encounter: Payer: Self-pay | Admitting: Family Medicine

## 2016-07-13 DIAGNOSIS — J40 Bronchitis, not specified as acute or chronic: Secondary | ICD-10-CM | POA: Diagnosis not present

## 2016-07-13 DIAGNOSIS — Z720 Tobacco use: Secondary | ICD-10-CM

## 2016-07-13 DIAGNOSIS — J069 Acute upper respiratory infection, unspecified: Secondary | ICD-10-CM | POA: Insufficient documentation

## 2016-07-13 DIAGNOSIS — B9789 Other viral agents as the cause of diseases classified elsewhere: Secondary | ICD-10-CM

## 2016-07-13 DIAGNOSIS — R05 Cough: Secondary | ICD-10-CM | POA: Diagnosis present

## 2016-07-13 DIAGNOSIS — J9801 Acute bronchospasm: Secondary | ICD-10-CM | POA: Insufficient documentation

## 2016-07-13 DIAGNOSIS — J209 Acute bronchitis, unspecified: Secondary | ICD-10-CM

## 2016-07-13 DIAGNOSIS — F17210 Nicotine dependence, cigarettes, uncomplicated: Secondary | ICD-10-CM | POA: Insufficient documentation

## 2016-07-13 MED ORDER — ALBUTEROL SULFATE HFA 108 (90 BASE) MCG/ACT IN AERS
2.0000 | INHALATION_SPRAY | RESPIRATORY_TRACT | Status: DC
  Administered 2016-07-13: 2 via RESPIRATORY_TRACT
  Filled 2016-07-13: qty 6.7

## 2016-07-13 MED ORDER — IBUPROFEN 400 MG PO TABS
600.0000 mg | ORAL_TABLET | Freq: Once | ORAL | Status: AC
  Administered 2016-07-13: 600 mg via ORAL
  Filled 2016-07-13: qty 1

## 2016-07-13 MED ORDER — PREDNISONE 20 MG PO TABS: 40.0000 mg | ORAL_TABLET | Freq: Every day | ORAL | 0 refills | Status: AC

## 2016-07-13 NOTE — Assessment & Plan Note (Signed)
Down to one cigarette with acute illness, encouraged complete cessation

## 2016-07-13 NOTE — Progress Notes (Signed)
Pre visit review using our clinic review tool, if applicable. No additional management support is needed unless otherwise documented below in the visit note. 

## 2016-07-13 NOTE — ED Provider Notes (Signed)
[facility]   CSN: [id number] Arrival date & time: 07/13/16  1212     History   Chief Complaint Chief Complaint  Patient presents with  . Cough    HPI Makayla Reid is a 45 y.o. female.  HPI Patient presents to the emergency department with complaints of ongoing coughing congestion and sinus pressure for the past 4 days.  She has had some diarrhea as well.  She was seen by her primary care physician today and diagnosed with a viral illness.  She presents with persistent cough and pressure.  She took 2 Tylenol early this morning.  She's had no medication since then.   Past Medical History:  Diagnosis Date  . Reinke's edema of vocal folds   . Tobacco abuse disorder 07/13/2016  . Upper respiratory infection 07/13/2016    Patient Active Problem List   Diagnosis Date Noted  . Upper respiratory infection 07/13/2016  . Tobacco abuse disorder 07/13/2016  . Depression 03/31/2016  . Chronic obstructive pulmonary disease (HCC) 03/31/2016  . HA (headache) 05/19/2013    Past Surgical History:  Procedure Laterality Date  . ABDOMINAL HYSTERECTOMY    . CHOLECYSTECTOMY    . MICROLARYNGOSCOPY    . TUBAL LIGATION      OB History    No data available       Home Medications    Prior to Admission medications   Medication Sig Start Date End Date Taking? Authorizing Provider  ALPRAZolam Prudy Feeler(XANAX) 1 MG tablet Take 1 mg by mouth 3 (three) times daily as needed for anxiety or sleep.     Historical Provider, MD  Omega-3 1000 MG CAPS Take 1 g by mouth.    Historical Provider, MD    Family History No family history on file.  Social History Social History  Substance Use Topics  . Smoking status: Current Every Day Smoker    Packs/day: 0.50    Types: Cigarettes  . Smokeless tobacco: Never Used  . Alcohol use Yes     Comment: socially     Allergies   Penicillins   Review of Systems Review of Systems  All other systems reviewed and are  negative.    Physical Exam Updated Vital Signs BP 141/83   Pulse 73   Temp 98.5 F (36.9 C) (Oral)   Resp 20   LMP 04/02/2014 Comment: tubal ligation  SpO2 98%   Physical Exam  Constitutional: She is oriented to person, place, and time. She appears well-developed and well-nourished. No distress.  HENT:  Head: Normocephalic and atraumatic.  Eyes: EOM are normal.  Neck: Normal range of motion.  Cardiovascular: Normal rate, regular rhythm and normal heart sounds.   Pulmonary/Chest: Effort normal and breath sounds normal.  Abdominal: Soft. She exhibits no distension. There is no tenderness.  Musculoskeletal: Normal range of motion.  Neurological: She is alert and oriented to person, place, and time.  Skin: Skin is warm and dry.  Psychiatric: She has a normal mood and affect. Judgment normal.  Nursing note and vitals reviewed.    ED Treatments / Results  Labs (all labs ordered are listed, but only abnormal results are displayed) Labs Reviewed - No data to display  EKG  EKG Interpretation None       Radiology Dg Chest 2 View  Result Date: 07/13/2016 CLINICAL DATA:  Three days of fatigue and myalgia with development of productive cough and throat irritation. Patient has had loose stools and nausea over the past 24 hours.  EXAM: CHEST  2 VIEW COMPARISON:  PA and lateral chest x-ray of May 19, 2016 FINDINGS: The lungs remain hyperinflated. There is no focal infiltrate. The interstitial markings are coarse though stable. The heart and pulmonary vascularity are normal. The mediastinum is normal in width. The bony thorax exhibits no acute abnormality. IMPRESSION: COPD. Chronic interstitial prominence likely reflecting smoking related changes. No pneumonia, CHF, nor other acute cardiopulmonary abnormality. Electronically Signed   By: David  SwazilandJordan M.D.   On: 07/13/2016 12:47    Procedures Procedures (including critical care time)  Medications Ordered in ED Medications    albuterol (PROVENTIL HFA;VENTOLIN HFA) 108 (90 Base) MCG/ACT inhaler 2 puff (2 puffs Inhalation Given 07/13/16 1248)  ibuprofen (ADVIL,MOTRIN) tablet 600 mg (600 mg Oral Given 07/13/16 1247)     Initial Impression / Assessment and Plan / ED Course  I have reviewed the triage vital signs and the nursing notes.  Pertinent labs & imaging results that were available during my care of the patient were reviewed by me and considered in my medical decision making (see chart for details).  Clinical Course     Likely viral URI with associated bronchospasm.  Albuterol and a three-day course of prednisone.  No indication for antibiotics.  Possible mild COPD exacerbation.  Final Clinical Impressions(s) / ED Diagnoses   Final diagnoses:  None    New Prescriptions New Prescriptions   No medications on file     Azalia BilisKevin Shyne Lehrke, MD 07/13/16 1255

## 2016-07-13 NOTE — ED Triage Notes (Signed)
Pt c/o cough, congestion, facial pain/pressure since Saturday; diarrhea since yesterday; was just seen at PCP this a.m. For same.

## 2016-07-13 NOTE — Assessment & Plan Note (Addendum)
Started with cough and head congestion several days ago diarrhea started yesterday. All this after babysitting her 281 and 260 year old Grandchildren who were ill. Encouraged clear fluids for 24 hours then BRAT diet as tolerated. For virus. Use Zinc, Vit C and Elderberry as tolerated. And push fluids with sips every few minutes due to dehydration. Call if symptoms worsen or change. 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

## 2016-07-13 NOTE — Patient Instructions (Signed)
Mostly clear liquids x 24 hours, sips every couple of minutes to include gatorade, pedialyte, sprite, ginger ale, etc Progress to SUPERVALU INCBRAT diet as tolerated (bananas, rice, applesauce, toast)  Zinc, such as coldeeze or xicam Vitamin C 500 to 1000 mg daily Elderberry daily for colds Probiotics daily, NOW company downstairs at pharmacy Call if worse or changing   For Diarrhea skin irritation, witch hazel cleanse and then Desitin with Zinc    Upper Respiratory Infection, Adult Most upper respiratory infections (URIs) are caused by a virus. A URI affects the nose, throat, and upper air passages. The most common type of URI is often called "the common cold." Follow these instructions at home:  Take medicines only as told by your doctor.  Gargle warm saltwater or take cough drops to comfort your throat as told by your doctor.  Use a warm mist humidifier or inhale steam from a shower to increase air moisture. This may make it easier to breathe.  Drink enough fluid to keep your pee (urine) clear or pale yellow.  Eat soups and other clear broths.  Have a healthy diet.  Rest as needed.  Go back to work when your fever is gone or your doctor says it is okay.  You may need to stay home longer to avoid giving your URI to others.  You can also wear a face mask and wash your hands often to prevent spread of the virus.  Use your inhaler more if you have asthma.  Do not use any tobacco products, including cigarettes, chewing tobacco, or electronic cigarettes. If you need help quitting, ask your doctor. Contact a doctor if:  You are getting worse, not better.  Your symptoms are not helped by medicine.  You have chills.  You are getting more short of breath.  You have brown or red mucus.  You have yellow or brown discharge from your nose.  You have pain in your face, especially when you bend forward.  You have a fever.  You have puffy (swollen) neck glands.  You have pain while  swallowing.  You have white areas in the back of your throat. Get help right away if:  You have very bad or constant:  Headache.  Ear pain.  Pain in your forehead, behind your eyes, and over your cheekbones (sinus pain).  Chest pain.  You have long-lasting (chronic) lung disease and any of the following:  Wheezing.  Long-lasting cough.  Coughing up blood.  A change in your usual mucus.  You have a stiff neck.  You have changes in your:  Vision.  Hearing.  Thinking.  Mood. This information is not intended to replace advice given to you by your health care provider. Make sure you discuss any questions you have with your health care provider. Document Released: 01/19/2008 Document Revised: 04/04/2016 Document Reviewed: 11/07/2013 Elsevier Interactive Patient Education  2017 [employer]

## 2016-07-13 NOTE — Progress Notes (Signed)
Patient ID: Makayla Reid, female   DOB: 05/05/1971, 45 y.o.   MRN: [id number]   Subjective:    Patient ID: Makayla Reid, female    DOB: 01/29/1971, 45 y.o.   MRN: [id number]  Chief Complaint  Patient presents with  . URI  . Diarrhea    HPI Patient is in today for evaluation of worsening respiratory symptoms. She has been sick for roughly 3 days now after babysitting her 2 young grandchildren who were sick. Started with fatigue and myalgias then progressed to head congestion, headache ear pain, cough productive of clear phlegm and throat irritation. Over past 24 hours has developed frequent loose stool and nausea. No vomiting. No bloody or tarry stool. No wheezing but she has cut down to one cig in past 2 days. No CP/palp or GU c/o.  Past Medical History:  Diagnosis Date  . Reinke's edema of vocal folds   . Tobacco abuse disorder 07/13/2016  . Upper respiratory infection 07/13/2016    Past Surgical History:  Procedure Laterality Date  . ABDOMINAL HYSTERECTOMY    . CHOLECYSTECTOMY    . MICROLARYNGOSCOPY    . TUBAL LIGATION      No family history on file.  Social History   Social History  . Marital status: Married    Spouse name: N/A  . Number of children: N/A  . Years of education: N/A   Occupational History  . Not on file.   Social History Main Topics  . Smoking status: Current Every Day Smoker    Packs/day: 0.50    Types: Cigarettes  . Smokeless tobacco: Never Used  . Alcohol use Yes     Comment: socially  . Drug use: No  . Sexual activity: Not on file   Other Topics Concern  . Not on file   Social History Narrative  . No narrative on file    Outpatient Medications Prior to Visit  Medication Sig Dispense Refill  . ALPRAZolam (XANAX) 1 MG tablet Take 1 mg by mouth 3 (three) times daily as needed for anxiety or sleep.     . Omega-3 1000 MG CAPS Take 1 g by mouth.    Marland Kitchen. azithromycin (ZITHROMAX) 250 MG tablet Use as a zpack 6 tablet 0  . FLUoxetine  (PROZAC) 20 MG tablet Take 1 tablet (20 mg total) by mouth daily. 30 tablet 5  . ibuprofen (ADVIL,MOTRIN) 800 MG tablet Take by mouth.    . meloxicam (MOBIC) 7.5 MG tablet Take 1 tablet (7.5 mg total) by mouth daily. 30 tablet 0  . Multiple Vitamin (MULTIVITAMIN) capsule Take 1 capsule by mouth daily.    . predniSONE (DELTASONE) 20 MG tablet Take 2 pills daily for 3 days, then 1 pill daily for 3 days 9 tablet 0   No facility-administered medications prior to visit.     Allergies  Allergen Reactions  . Penicillins Rash    Review of Systems  Constitutional: Positive for chills, fever and malaise/fatigue.  HENT: Positive for congestion, ear pain and sinus pain. Negative for ear discharge and hearing loss.   Eyes: Negative for blurred vision.  Respiratory: Positive for cough and sputum production. Negative for shortness of breath and wheezing.   Cardiovascular: Negative for chest pain, palpitations and leg swelling.  Gastrointestinal: Positive for diarrhea and nausea. Negative for abdominal pain, blood in stool, melena and vomiting.  Genitourinary: Negative for dysuria and frequency.  Musculoskeletal: Negative for falls.  Skin: Negative for rash.  Neurological: Negative for dizziness, loss  of consciousness and headaches.  Endo/Heme/Allergies: Negative for environmental allergies.  Psychiatric/Behavioral: Negative for depression. The patient is not nervous/anxious.        Objective:    Physical Exam  Constitutional: She is oriented to person, place, and time. She appears well-developed and well-nourished. No distress.  HENT:  Head: Normocephalic and atraumatic.  Nose: Nose normal.  TMs dull and retracted. Dry mucus membranes, posterior oropharynx erythematous.   Eyes: Right eye exhibits no discharge. Left eye exhibits no discharge.  Neck: Normal range of motion. Neck supple.  Cardiovascular: Normal rate and regular rhythm.   No murmur heard. Pulmonary/Chest: Effort normal and  breath sounds normal. No respiratory distress. She has no wheezes.  Mildly depressed Breath sounds b/l bases.  Abdominal: Soft. Bowel sounds are normal. There is no tenderness.  Musculoskeletal: She exhibits no edema.  Lymphadenopathy:    She has cervical adenopathy.  Neurological: She is alert and oriented to person, place, and time.  Skin: Skin is warm and dry.  Psychiatric: She has a normal mood and affect.  Nursing note and vitals reviewed.   BP 112/82 (BP Location: Right Arm, Patient Position: Sitting, Cuff Size: Normal)   Pulse 77   Temp (!) 96 F (35.6 C) (Oral)   Ht 5\' 4"  (1.626 m)   Wt 106 lb 6 oz (48.3 kg)   LMP 04/02/2014 Comment: tubal ligation  SpO2 96%   BMI 18.26 kg/m  Wt Readings from Last 3 Encounters:  07/13/16 106 lb 6 oz (48.3 kg)  05/19/16 115 lb (52.2 kg)  03/31/16 111 lb 6.4 oz (50.5 kg)     Lab Results  Component Value Date   WBC 6.2 03/10/2016   HGB 13.7 03/10/2016   HCT 40.9 03/10/2016   PLT 182 03/10/2016   GLUCOSE 87 03/10/2016   NA 139 03/10/2016   K 4.2 03/10/2016   CL 105 03/10/2016   CREATININE 0.76 03/10/2016   BUN 11 03/10/2016   CO2 29 03/10/2016    No results found for: TSH Lab Results  Component Value Date   WBC 6.2 03/10/2016   HGB 13.7 03/10/2016   HCT 40.9 03/10/2016   MCV 95.3 03/10/2016   PLT 182 03/10/2016   Lab Results  Component Value Date   NA 139 03/10/2016   K 4.2 03/10/2016   CO2 29 03/10/2016   GLUCOSE 87 03/10/2016   BUN 11 03/10/2016   CREATININE 0.76 03/10/2016   CALCIUM 9.2 03/10/2016   ANIONGAP 5 03/10/2016   No results found for: CHOL No results found for: HDL No results found for: LDLCALC No results found for: TRIG No results found for: CHOLHDL No results found for: NFAO1H     Assessment & Plan:   Problem List Items Addressed This Visit    Upper respiratory infection    Started with cough and head congestion several days ago diarrhea started yesterday. All this after babysitting her 67  and 23 year old Grandchildren who were ill. Encouraged clear fluids for 24 hours then BRAT diet as tolerated. For virus. Use Zinc, Vit C and Elderberry as tolerated. And push fluids with sips every few minutes due to dehydration. Call if symptoms worsen or change. 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 Grindle's multivitamin, ibuprofen, meloxicam, predniSONE,  FLUoxetine, and azithromycin. I am also having her maintain her ALPRAZolam and Omega-3.  No orders of the defined types were placed in this encounter.    Danise EdgeBLYTH, Sacha Topor, MD

## 2016-07-19 ENCOUNTER — Telehealth: Payer: Self-pay | Admitting: Family Medicine

## 2016-07-19 DIAGNOSIS — J441 Chronic obstructive pulmonary disease with (acute) exacerbation: Secondary | ICD-10-CM

## 2016-07-19 MED ORDER — DOXYCYCLINE HYCLATE 100 MG PO CAPS: 100.0000 mg | ORAL_CAPSULE | Freq: Two times a day (BID) | ORAL | 0 refills | Status: DC

## 2016-07-19 MED ORDER — ALPRAZOLAM 1 MG PO TABS: 1.0000 mg | ORAL_TABLET | Freq: Three times a day (TID) | ORAL | 0 refills | Status: AC | PRN

## 2016-07-19 NOTE — Telephone Encounter (Signed)
Caller name: Relationship to patient: Self Can be reached: [phone]  [facility] [phone] (Phone) [phone] (Fax)     Reason for call: Was seen in the ER and diagnosed with Bronchitis. Was given steriods and an inhaler but wants an antibiotic. Request to have antibiotic called in.

## 2016-07-19 NOTE — Telephone Encounter (Signed)
She was seen about 6 days ago at the ER with 4 days of illness.  She is still coughing despite treatment with prednisone in the ER   Will rx doxycycline for her She takes 1mg  of xanax TID - she has done so for 9 years.   She is not on anything for anxiety otherwise.  She had been getting this from her precious PCP and is due for a refill soon. Advised that I will refill this for her, but she will need to come and see me in the next month to discuss it further.  We will want to consider adding an SSRI to help with her anxiety so we can reduce her xanax use

## 2016-10-17 ENCOUNTER — Encounter (HOSPITAL_COMMUNITY): Payer: Self-pay

## 2016-10-17 ENCOUNTER — Emergency Department (HOSPITAL_COMMUNITY)
Admission: EM | Admit: 2016-10-17 | Discharge: 2016-10-17 | Disposition: A | Discharge status: Home / Self Care | Payer: BLUE CROSS/BLUE SHIELD | Attending: Emergency Medicine | Admitting: Emergency Medicine

## 2016-10-17 DIAGNOSIS — Z79899 Other long term (current) drug therapy: Secondary | ICD-10-CM | POA: Insufficient documentation

## 2016-10-17 DIAGNOSIS — F17210 Nicotine dependence, cigarettes, uncomplicated: Secondary | ICD-10-CM | POA: Insufficient documentation

## 2016-10-17 DIAGNOSIS — J069 Acute upper respiratory infection, unspecified: Secondary | ICD-10-CM | POA: Insufficient documentation

## 2016-10-17 MED ORDER — AZITHROMYCIN 250 MG PO TABS: 250.0000 mg | ORAL_TABLET | Freq: Every day | ORAL | 0 refills | Status: DC

## 2016-10-17 MED ORDER — OXYMETAZOLINE HCL 0.05 % NA SOLN: 1.0000 | Freq: Two times a day (BID) | NASAL | 0 refills | Status: DC

## 2016-10-17 NOTE — Discharge Instructions (Signed)
Take your medications as prescribed until completed. I also drink fluids at home to remain hydrated. Follow-up with your primary care provider in the next 3-4 days if your symptoms have not improved or worsen. Return to the emergency department if symptoms worsen or new onset of fever, headache, neck stiffness, facial/neck swelling, unable to swallow resulting in drooling, difficulty breathing, chest pain, vomiting.

## 2016-10-17 NOTE — ED Provider Notes (Signed)
[facility]-EMERGENCY DEPT Provider Note   CSN: [id number] Arrival date & time: 10/17/16  2015  By signing my name below, I, Marnette Burgessyan Andrew Long, attest that this documentation has been prepared under the direction and in the presence of Lanae CrumblyNicole E. Irem Stoneham, PA-C. Electronically Signed: Marnette Burgessyan Andrew Long, Scribe. 10/17/2016. 9:02 PM.   History   Chief Complaint Chief Complaint  Patient presents with  . URI   The history is provided by the patient. No language interpreter was used.    HPI Comments:  Makayla Reid is a 46 y.o. female with a PMHx of Reinke's edema of vocal folds and h/o URI and Tobacco abuse, who presents to the Emergency Department complaining of constant, gradually worsening congestion onset four days ago. She reports this feeling of congestion and dehydration worsening over the past four days. She presents to [facility]ED today as the feeling dehydrated persisted throughout today and her sinus pressure began. Pt has associated symptoms of sinus pressure, HA, hoarse voice, mild rhinorrhea, and mild sore throat. She tried Claritin, Sudafed, and herbal tea at home with minimal relief of her symptoms. Pt denies fever, wheezing, CP, SOB, vomiting, cough, and any other complaints at this time. Pt is a current every day smoker.   Past Medical History:  Diagnosis Date  . Reinke's edema of vocal folds   . Tobacco abuse disorder 07/13/2016  . Upper respiratory infection 07/13/2016    Patient Active Problem List   Diagnosis Date Noted  . Upper respiratory infection 07/13/2016  . Tobacco abuse disorder 07/13/2016  . Depression 03/31/2016  . Chronic obstructive pulmonary disease (HCC) 03/31/2016  . HA (headache) 05/19/2013    Past Surgical History:  Procedure Laterality Date  . ABDOMINAL HYSTERECTOMY    . CHOLECYSTECTOMY    . MICROLARYNGOSCOPY    . TUBAL LIGATION      OB History    No data available       Home Medications    Prior to Admission medications   Medication Sig Start  Date End Date Taking? Authorizing Provider  ALPRAZolam Prudy Feeler(XANAX) 1 MG tablet Take 1 tablet (1 mg total) by mouth 3 (three) times daily as needed for anxiety or sleep. Office visit needed 07/19/16   Pearline CablesJessica C Copland, MD  azithromycin (ZITHROMAX) 250 MG tablet Take 1 tablet (250 mg total) by mouth daily. Take first 2 tablets together, then 1 every day until finished. 10/17/16   Barrett HenleNicole Elizabeth Adaleen Hulgan, PA-C  doxycycline (VIBRAMYCIN) 100 MG capsule Take 1 capsule (100 mg total) by mouth 2 (two) times daily. 07/19/16   Gwenlyn FoundJessica C Copland, MD  Omega-3 1000 MG CAPS Take 1 g by mouth.    Historical Provider, MD  oxymetazoline (AFRIN NASAL SPRAY) 0.05 % nasal spray Place 1 spray into both nostrils 2 (two) times daily. Spray once into each nostril twice daily for up to the next 3 days. Do not use for more than 3 days to prevent rebound rhinorrhea. 10/17/16   Barrett HenleNicole Elizabeth Ketty Bitton, PA-C    Family History History reviewed. No pertinent family history.  Social History Social History  Substance Use Topics  . Smoking status: Current Every Day Smoker    Packs/day: 0.50    Types: Cigarettes  . Smokeless tobacco: Never Used  . Alcohol use Yes     Comment: socially     Allergies   Penicillins   Review of Systems Review of Systems  Constitutional: Positive for appetite change. Negative for fever.  HENT: Positive for congestion, rhinorrhea (mild), sinus pressure,  sore throat (mild) and voice change.   Respiratory: Negative for cough, shortness of breath and wheezing.   Cardiovascular: Negative for chest pain.  Gastrointestinal: Negative for vomiting.  Neurological: Positive for headaches.     Physical Exam Updated Vital Signs BP 110/97 (BP Location: Left Arm)   Pulse 72   Temp 97.7 F (36.5 C) (Oral)   Resp 16   Ht 5\' 4"  (1.626 m)   Wt 46.7 kg   LMP 04/02/2014 Comment: tubal ligation  SpO2 98%   BMI 17.68 kg/m   Physical Exam  Constitutional: She is oriented to person, place, and time. She  appears well-developed and well-nourished.  HENT:  Head: Normocephalic and atraumatic.  Right Ear: Tympanic membrane normal.  Left Ear: Tympanic membrane normal.  Nose: Rhinorrhea present. Right sinus exhibits no maxillary sinus tenderness and no frontal sinus tenderness. Left sinus exhibits no maxillary sinus tenderness and no frontal sinus tenderness.  Mouth/Throat: Uvula is midline, oropharynx is clear and moist and mucous membranes are normal. No oropharyngeal exudate, posterior oropharyngeal edema, posterior oropharyngeal erythema or tonsillar abscesses. No tonsillar exudate.  No trismus, drooling, facial/neck swelling or stridor on exam. No muffled voice. Floor of mouth soft.    Eyes: Conjunctivae and EOM are normal. Right eye exhibits no discharge. Left eye exhibits no discharge. No scleral icterus.  Neck: Normal range of motion. Neck supple.  Cardiovascular: Normal rate, regular rhythm, normal heart sounds and intact distal pulses.   Pulmonary/Chest: Effort normal and breath sounds normal. No respiratory distress. She has no wheezes. She has no rales. She exhibits no tenderness.  Abdominal: Soft. Bowel sounds are normal. She exhibits no distension. There is no tenderness.  Musculoskeletal: Normal range of motion. She exhibits no edema.  Lymphadenopathy:    She has no cervical adenopathy.  Neurological: She is alert and oriented to person, place, and time.  Skin: Skin is warm and dry.  Psychiatric: She has a normal mood and affect.  Nursing note and vitals reviewed.    ED Treatments / Results  DIAGNOSTIC STUDIES:  Oxygen Saturation is 98% on RA, normal by my interpretation.    COORDINATION OF CARE:  8:59 PM Discussed treatment plan with pt at bedside including Azithromycin, Ibuprofen, and decongestant nasal spray and pt agreed to plan.  Labs (all labs ordered are listed, but only abnormal results are displayed) Labs Reviewed - No data to display  EKG  EKG  Interpretation None       Radiology No results found.  Procedures Procedures (including critical care time)  Medications Ordered in ED Medications - No data to display   Initial Impression / Assessment and Plan / ED Course  I have reviewed the triage vital signs and the nursing notes.  Pertinent labs & imaging results that were available during my care of the patient were reviewed by me and considered in my medical decision making (see chart for details).     Pt symptoms consistent with URI. Pt will be discharged with symptomatic treatment and abx due to pt reporting significant worsening of sxs over the past 4 days.  Discussed return precautions.  Pt is hemodynamically stable & in NAD prior to discharge.   Final Clinical Impressions(s) / ED Diagnoses   Final diagnoses:  Upper respiratory tract infection, unspecified type    New Prescriptions New Prescriptions   AZITHROMYCIN (ZITHROMAX) 250 MG TABLET    Take 1 tablet (250 mg total) by mouth daily. Take first 2 tablets together, then 1 every day until  finished.   OXYMETAZOLINE (AFRIN NASAL SPRAY) 0.05 % NASAL SPRAY    Place 1 spray into both nostrils 2 (two) times daily. Spray once into each nostril twice daily for up to the next 3 days. Do not use for more than 3 days to prevent rebound rhinorrhea.   I personally performed the services described in this documentation, which was scribed in my presence. The recorded information has been reviewed and is accurate.     Satira Sark [place], [place] 10/17/16 2109    Mancel Bale, MD 10/18/16 [phone]

## 2016-10-17 NOTE — ED Triage Notes (Signed)
Pt states that she has been experiencing congestion, hoarse voice, and sinus pressure. She denies sore throat. A&Ox4. Ambulatory.

## 2016-11-09 ENCOUNTER — Ambulatory Visit (INDEPENDENT_AMBULATORY_CARE_PROVIDER_SITE_OTHER): Payer: BLUE CROSS/BLUE SHIELD | Admitting: Allergy and Immunology

## 2016-11-09 ENCOUNTER — Telehealth: Payer: Self-pay

## 2016-11-09 ENCOUNTER — Encounter: Payer: Self-pay | Admitting: Allergy and Immunology

## 2016-11-09 VITALS — BP 120/70 | HR 65 | Temp 98.3°F | Resp 16 | Ht 62.99 in | Wt 99.4 lb

## 2016-11-09 DIAGNOSIS — L309 Dermatitis, unspecified: Secondary | ICD-10-CM | POA: Diagnosis not present

## 2016-11-09 DIAGNOSIS — H1013 Acute atopic conjunctivitis, bilateral: Secondary | ICD-10-CM | POA: Diagnosis not present

## 2016-11-09 DIAGNOSIS — Z720 Tobacco use: Secondary | ICD-10-CM

## 2016-11-09 DIAGNOSIS — H1010 Acute atopic conjunctivitis, unspecified eye: Secondary | ICD-10-CM | POA: Insufficient documentation

## 2016-11-09 DIAGNOSIS — J3089 Other allergic rhinitis: Secondary | ICD-10-CM | POA: Diagnosis not present

## 2016-11-09 DIAGNOSIS — J4540 Moderate persistent asthma, uncomplicated: Secondary | ICD-10-CM

## 2016-11-09 MED ORDER — LEVOCETIRIZINE DIHYDROCHLORIDE 5 MG PO TABS: 5.0000 mg | ORAL_TABLET | Freq: Every evening | ORAL | 5 refills | Status: DC

## 2016-11-09 MED ORDER — TRIAMCINOLONE ACETONIDE 0.1 % EX OINT: 1.0000 "application " | TOPICAL_OINTMENT | Freq: Two times a day (BID) | CUTANEOUS | 0 refills | Status: DC | PRN

## 2016-11-09 MED ORDER — ALBUTEROL SULFATE HFA 108 (90 BASE) MCG/ACT IN AERS: 2.0000 | INHALATION_SPRAY | RESPIRATORY_TRACT | 1 refills | Status: DC | PRN

## 2016-11-09 MED ORDER — MONTELUKAST SODIUM 10 MG PO TABS: 10.0000 mg | ORAL_TABLET | Freq: Every day | ORAL | 5 refills | Status: DC

## 2016-11-09 MED ORDER — FLUTICASONE PROPIONATE HFA 110 MCG/ACT IN AERO: 2.0000 | INHALATION_SPRAY | Freq: Two times a day (BID) | RESPIRATORY_TRACT | 5 refills | Status: DC

## 2016-11-09 NOTE — Assessment & Plan Note (Addendum)
Today's spirometry results, assessed while asymptomatic, suggest under-perception of bronchoconstriction.  Tobacco cessation has been discussed and encouraged.  A prescription has been provided for Flovent 110 g, 2 inhalations twice a day.  To maximize pulmonary deposition, a spacer has been provided along with instructions for its proper administration with an HFA inhaler.  A prescription has been provided for montelukast 10 mg daily at bedtime.  A prescription has been provided for albuterol HFA, 1-2 inhalations every 4-6 hours as needed.  Subjective and objective measures of pulmonary function will be followed and the treatment plan will be adjusted accordingly.

## 2016-11-09 NOTE — Assessment & Plan Note (Signed)
Unclear etiology.  The patient's history is not consistent with urticaria, contact dermatitis, or atopic dermatitis. Food allergen skin tests were negative today despite a positive histamine control.  A prescription has been provided for triamcinolone 0.1% cream sparingly to affected areas twice daily as needed.  If symptoms persist or progress, dermatology evaluation with biopsy of an active lesion is recommended.

## 2016-11-09 NOTE — Progress Notes (Signed)
New Patient Note  RE: Makayla Reid MRN: [id number] DOB: Apr 23, 1971 Date of Office Visit: 11/09/2016  Referring provider: No ref. provider found Primary care provider: Mickle Plumb, NP  Chief Complaint: Nasal Congestion; Sinus Problem; Breathing Problem; and Rash   History of present illness: Makayla Reid is a 46 y.o. female presenting today for evaluation of rhinosinusitis.  She complains of nasal congestion, rhinorrhea, nasal pruritus, postnasal drainage, hoarseness, watery eyes, ear fullness, and sinus pressure over the forehead and cheekbones.  These symptoms are most frequent and severe in the spring, summer, and fall.  She also complains of feeling fatigued and lacking energy when her allergy symptoms are active.  She reports that she has 5 or 6 sinus infections per year, typically throughout the warmer months.  She has tried multiple medications including pseudoephedrine and over-the-counter antihistamines without adequate symptom relief.  She does not use nasal sprays for she believes that all medicated nasal sprays cause a burning sensation in her nose.  She also complains of an erythematous, transient rash that appears on her lower extremities, typically during the spring and summer.  The rash is nonraised, nonvesicular, nonpruritic, and nonpainful.  She has been smoking cigarettes over the past 7 years and is attempting to cut back.  She currently smokes 2 or 3 cigarettes per day.  She admits to dyspnea when she is talking, but denies dyspnea, chest tightness, or wheezing at other times.   Assessment and plan: Perennial and seasonal allergic rhinitis  Aeroallergen avoidance measures have been discussed and provided in written form.  A prescription has been provided for levocetirizine, 5mg  daily as needed for runny nose, itchy nose, sneezing, and itchy eyes.  For thick post nasal drainage, nasal congestion, and/or sinus pressure, add guaifenesin [phone] mg (Mucinex) plus/minus  pseudoephedrine 60-120 mg  twice daily as needed with adequate hydration as discussed. Pseudoephedrine is only to be used for short-term relief of nasal/sinus congestion. Long-term use is discouraged due to potential side effects.  A prescription has been provided for Rhinocort AQ, one spray per nostril 1-2 times daily as needed.  Nasal saline lavage (NeilMed) has been recommended prior to medicated nasal sprays and as needed along with instructions for proper administration.  The risks and benefits of aeroallergen immunotherapy have been discussed. The patient is motivated to initiate immunotherapy if insurance coverage is favorable. She will let us know how she would like to proceed.  Moderate persistent asthma Today's spirometry results, assessed while asymptomatic, suggest under-perception of bronchoconstriction.  Tobacco cessation has been discussed and encouraged.  A prescription has been provided for Flovent 110 g, 2 inhalations twice a day.  To maximize pulmonary deposition, a spacer has been provided along with instructions for its proper administration with an HFA inhaler.  A prescription has been provided for montelukast 10 mg daily at bedtime.  A prescription has been provided for albuterol HFA, 1-2 inhalations every 4-6 hours as needed.  Subjective and objective measures of pulmonary function will be followed and the treatment plan will be adjusted accordingly.  Dermatitis Unclear etiology.  The patient's history is not consistent with urticaria, contact dermatitis, or atopic dermatitis. Food allergen skin tests were negative today despite a positive histamine control.  A prescription has been provided for triamcinolone 0.1% cream sparingly to affected areas twice daily as needed.  If symptoms persist or progress, dermatology evaluation with biopsy of an active lesion is recommended.   Meds ordered this encounter  Medications  . levocetirizine (XYZAL) 5 MG  tablet     Sig: Take 1 tablet (5 mg total) by mouth every evening.    Dispense:  30 tablet    Refill:  5  . fluticasone (FLOVENT HFA) 110 MCG/ACT inhaler    Sig: Inhale 2 puffs into the lungs 2 (two) times daily.    Dispense:  1 Inhaler    Refill:  5  . montelukast (SINGULAIR) 10 MG tablet    Sig: Take 1 tablet (10 mg total) by mouth at bedtime.    Dispense:  30 tablet    Refill:  5  . albuterol (PROAIR HFA) 108 (90 Base) MCG/ACT inhaler    Sig: Inhale 2 puffs into the lungs every 4 (four) hours as needed for wheezing or shortness of breath.    Dispense:  1 Inhaler    Refill:  1  . triamcinolone ointment (KENALOG) 0.1 %    Sig: Apply 1 application topically 2 (two) times daily as needed. Use sparingly to affected areas on the body (below the face/neck) twice daily as needed.    Dispense:  30 g    Refill:  0    Diagnostics: Spirometry: FVC was 1.70 L (58% predicted) and FEV1 was 1.05 L (42% predicted) with an FEV1 ratio of 77%.  There was significant (490 mL, 47%) postbronchodilator improvement.  Restrictive/obstructive pattern with significant reversibility.This study was performed while the patient was asymptomatic.  Please see scanned spirometry results for details. Environmental skin testing: Positive to grass pollens, weed pollens, ragweed pollen, tree pollens, molds, cat hair, dog epithelia, and dust mite antigen. Food allergen skin testing:  Negative despite a positive histamine control.    Physical examination: Blood pressure 120/70, pulse 65, temperature 98.3 F (36.8 C), temperature source Oral, resp. rate 16, height 5' 2.99" (1.6 m), weight 99 lb 6.4 oz (45.1 kg), last menstrual period 04/02/2014, SpO2 98 %.  General: Alert, interactive, in no acute distress. HEENT: TMs pearly gray, turbinates edematous without discharge, post-pharynx erythematous. Neck: Supple without lymphadenopathy. Lungs: Decreased breath sounds bilaterally without wheezing, rhonchi or rales. CV: Normal S1, S2  without murmurs. Abdomen: Nondistended, nontender. Skin: Warm and dry, without lesions or rashes. Extremities:  No clubbing, cyanosis or edema. Neuro:   Grossly intact.  Review of systems:  Review of systems negative except as noted in HPI / PMHx or noted below: Review of Systems  Constitutional: Negative.   HENT: Negative.   Eyes: Negative.   Respiratory: Negative.   Cardiovascular: Negative.   Gastrointestinal: Negative.   Genitourinary: Negative.   Musculoskeletal: Negative.   Skin: Negative.   Neurological: Negative.   Endo/Heme/Allergies: Negative.   Psychiatric/Behavioral: Negative.     Past medical history:  Past Medical History:  Diagnosis Date  . Reinke's edema of vocal folds   . Tobacco abuse disorder 07/13/2016  . Upper respiratory infection 07/13/2016    Past surgical history:  Past Surgical History:  Procedure Laterality Date  . ABDOMINAL HYSTERECTOMY    . CHOLECYSTECTOMY    . MICROLARYNGOSCOPY    . TUBAL LIGATION      Family history: No significant or contributory family history has been reported.  Social history: Social History   Social History  . Marital status: Married    Spouse name: N/A  . Number of children: N/A  . Years of education: N/A   Occupational History  . Not on file.   Social History Main Topics  . Smoking status: Current Every Day Smoker    Packs/day: 0.50    Types: Cigarettes  .  Smokeless tobacco: Never Used  . Alcohol use Yes     Comment: socially  . Drug use: No  . Sexual activity: Not on file   Other Topics Concern  . Not on file   Social History Narrative  . No narrative on file   Environmental History: The patient lives in a house built in 1996 with carpeting throughout and central air/heat.  There is a dog in house which does not have access to her bedroom.  She has smoked cigarettes over the past 7 years and has decreased from half pack per day to 2 or 3 cigarettes per day.  Allergies as of 11/09/2016       Reactions   Penicillins Rash      Medication List       Accurate as of 11/09/16  2:49 PM. Always use your most recent med list.          albuterol 108 (90 Base) MCG/ACT inhaler Commonly known as:  PROAIR HFA Inhale 2 puffs into the lungs every 4 (four) hours as needed for wheezing or shortness of breath.   ALPRAZolam 1 MG tablet Commonly known as:  XANAX Take 1 tablet (1 mg total) by mouth 3 (three) times daily as needed for anxiety or sleep. Office visit needed   fluticasone 110 MCG/ACT inhaler Commonly known as:  FLOVENT HFA Inhale 2 puffs into the lungs 2 (two) times daily.   levocetirizine 5 MG tablet Commonly known as:  XYZAL Take 1 tablet (5 mg total) by mouth every evening.   montelukast 10 MG tablet Commonly known as:  SINGULAIR Take 1 tablet (10 mg total) by mouth at bedtime.   Omega-3 1000 MG Caps Take 1 g by mouth.   THERA Tabs Take by mouth.   triamcinolone ointment 0.1 % Commonly known as:  KENALOG Apply 1 application topically 2 (two) times daily as needed. Use sparingly to affected areas on the body (below the face/neck) twice daily as needed.       Known medication allergies: Allergies  Allergen Reactions  . Penicillins Rash    I appreciate the opportunity to take part in Makayla Reid's care. Please do not hesitate to contact me with questions.  Sincerely,   R. Jorene Guestarter Teah Votaw, MD

## 2016-11-09 NOTE — Patient Instructions (Addendum)
Perennial and seasonal allergic rhinitis  Aeroallergen avoidance measures have been discussed and provided in written form.  A prescription has been provided for levocetirizine, 5mg  daily as needed for runny nose, itchy nose, sneezing, and itchy eyes.  For thick post nasal drainage, nasal congestion, and/or sinus pressure, add guaifenesin [phone] mg (Mucinex) plus/minus pseudoephedrine 60-120 mg  twice daily as needed with adequate hydration as discussed. Pseudoephedrine is only to be used for short-term relief of nasal/sinus congestion. Long-term use is discouraged due to potential side effects.  A prescription has been provided for Rhinocort AQ, one spray per nostril 1-2 times daily as needed.  Nasal saline lavage (NeilMed) has been recommended prior to medicated nasal sprays and as needed along with instructions for proper administration.  The risks and benefits of aeroallergen immunotherapy have been discussed. The patient is motivated to initiate immunotherapy if insurance coverage is favorable. She will let us know how she would like to proceed.  Moderate persistent asthma Today's spirometry results, assessed while asymptomatic, suggest under-perception of bronchoconstriction.  Tobacco cessation has been discussed and encouraged.  A prescription has been provided for Flovent 110 g, 2 inhalations twice a day.  To maximize pulmonary deposition, a spacer has been provided along with instructions for its proper administration with an HFA inhaler.  A prescription has been provided for montelukast 10 mg daily at bedtime.  A prescription has been provided for albuterol HFA, 1-2 inhalations every 4-6 hours as needed.  Subjective and objective measures of pulmonary function will be followed and the treatment plan will be adjusted accordingly.  Dermatitis Unclear etiology.  The patient's history is not consistent with urticaria, contact dermatitis, or atopic dermatitis. Food allergen skin  tests were negative today despite a positive histamine control.  A prescription has been provided for triamcinolone 0.1% cream sparingly to affected areas twice daily as needed.  If symptoms persist or progress, dermatology evaluation with biopsy of an active lesion is recommended.   Return in about 2 months (around 01/09/2017), or if symptoms worsen or fail to improve.  Reducing Pollen Exposure  The American Academy of Allergy, Asthma and Immunology suggests the following steps to reduce your exposure to pollen during allergy seasons.    1. Do not hang sheets or clothing out to dry; pollen may collect on these items. 2. Do not mow lawns or spend time around freshly cut grass; mowing stirs up pollen. 3. Keep windows closed at night.  Keep car windows closed while driving. 4. Minimize morning activities outdoors, a time when pollen counts are usually at their highest. 5. Stay indoors as much as possible when pollen counts or humidity is high and on windy days when pollen tends to remain in the air longer. 6. Use air conditioning when possible.  Many air conditioners have filters that trap the pollen spores. 7. Use a HEPA room air filter to remove pollen form the indoor air you breathe.   Control of House Dust Mite Allergen  House dust mites play a major role in allergic asthma and rhinitis.  They occur in environments with high humidity wherever human skin, the food for dust mites is found. High levels have been detected in dust obtained from mattresses, pillows, carpets, upholstered furniture, bed covers, clothes and soft toys.  The principal allergen of the house dust mite is found in its feces.  A gram of dust may contain 1,000 mites and 250,000 fecal particles.  Mite antigen is easily measured in the air during house cleaning activities.  1. Encase mattresses, including the box spring, and pillow, in an air tight cover.  Seal the zipper end of the encased mattresses with wide adhesive  tape. 2. Wash the bedding in water of 130 degrees Farenheit weekly.  Avoid cotton comforters/quilts and flannel bedding: the most ideal bed covering is the dacron comforter. 3. Remove all upholstered furniture from the bedroom. 4. Remove carpets, carpet padding, rugs, and non-washable window drapes from the bedroom.  Wash drapes weekly or use plastic window coverings. 5. Remove all non-washable stuffed toys from the bedroom.  Wash stuffed toys weekly. 6. Have the room cleaned frequently with a vacuum cleaner and a damp dust-mop.  The patient should not be in a room which is being cleaned and should wait 1 hour after cleaning before going into the room. 7. Close and seal all heating outlets in the bedroom.  Otherwise, the room will become filled with dust-laden air.  An electric heater can be used to heat the room. Reduce indoor humidity to less than 50%.  Do not use a humidifier.  Control of Dog or Cat Allergen  Avoidance is the best way to manage a dog or cat allergy. If you have a dog or cat and are allergic to dog or cats, consider removing the dog or cat from the home. If you have a dog or cat but don't want to find it a new home, or if your family wants a pet even though someone in the household is allergic, here are some strategies that may help keep symptoms at bay:  1. Keep the pet out of your bedroom and restrict it to only a few rooms. Be advised that keeping the dog or cat in only one room will not limit the allergens to that room. 2. Don't pet, hug or kiss the dog or cat; if you do, wash your hands with soap and water. 3. High-efficiency particulate air (HEPA) cleaners run continuously in a bedroom or living room can reduce allergen levels over time. 4. Regular use of a high-efficiency vacuum cleaner or a central vacuum can reduce allergen levels. 5. Giving your dog or cat a bath at least once a week can reduce airborne allergen.  Control of Mold Allergen  Mold and fungi can grow on  a variety of surfaces provided certain temperature and moisture conditions exist.  Outdoor molds grow on plants, decaying vegetation and soil.  The major outdoor mold, Alternaria and Cladosporium, are found in very high numbers during hot and dry conditions.  Generally, a late Summer - Fall peak is seen for common outdoor fungal spores.  Rain will temporarily lower outdoor mold spore count, but counts rise rapidly when the rainy period ends.  The most important indoor molds are Aspergillus and Penicillium.  Dark, humid and poorly ventilated basements are ideal sites for mold growth.  The next most common sites of mold growth are the bathroom and the kitchen.  Outdoor MicrosoftMold Control 7. Use air conditioning and keep windows closed 8. Avoid exposure to decaying vegetation. 9. Avoid leaf raking. 10. Avoid grain handling. 11. Consider wearing a face mask if working in moldy areas.  Indoor Mold Control 1. Maintain humidity below 50%. 2. Clean washable surfaces with 5% bleach solution. 3. Remove sources e.g. Contaminated carpets.

## 2016-11-09 NOTE — Telephone Encounter (Signed)
I spoke to patient per Dr. Nunzio CobbsBobbitt to inform patient that her montelukast can cause depression. I asked patient if she wanted us to change her medications.  She stated that she will try the medication and and will let us know how she is feeling. Patient understood and aware of the side effects of this medication.

## 2016-11-09 NOTE — Assessment & Plan Note (Signed)
   Aeroallergen avoidance measures have been discussed and provided in written form.  A prescription has been provided for levocetirizine, 5mg  daily as needed for runny nose, itchy nose, sneezing, and itchy eyes.  For thick post nasal drainage, nasal congestion, and/or sinus pressure, add guaifenesin [phone] mg (Mucinex) plus/minus pseudoephedrine 60-120 mg  twice daily as needed with adequate hydration as discussed. Pseudoephedrine is only to be used for short-term relief of nasal/sinus congestion. Long-term use is discouraged due to potential side effects.  A prescription has been provided for Rhinocort AQ, one spray per nostril 1-2 times daily as needed.  Nasal saline lavage (NeilMed) has been recommended prior to medicated nasal sprays and as needed along with instructions for proper administration.  The risks and benefits of aeroallergen immunotherapy have been discussed. The patient is motivated to initiate immunotherapy if insurance coverage is favorable. She will let us know how she would like to proceed.

## 2016-11-10 ENCOUNTER — Telehealth: Payer: Self-pay | Admitting: Allergy and Immunology

## 2016-11-10 NOTE — Telephone Encounter (Signed)
Discontinue montelukast.  Please send in a prescription for Symbicort 160/4.5 g, 2 inhalations via spacer device twice a day.  Please inform the patient that Symbicort will take the place of montelukast and Flovent.  Thanks.

## 2016-11-10 NOTE — Telephone Encounter (Signed)
Patient called back and wanted to know if we sent her a different medication in. I informed patient that I sent the message to Dr. Nunzio CobbsBobbitt. I also informed patient that we would contact her when Dr. Nunzio CobbsBobbitt replies.

## 2016-11-10 NOTE — Telephone Encounter (Signed)
Pt called and said that she needs to change the Montelukast to something else because she feels like it making her feel funny and a little bit of depression. [facility]. [phone].

## 2016-11-10 NOTE — Telephone Encounter (Signed)
Please advise 

## 2016-11-11 ENCOUNTER — Other Ambulatory Visit: Payer: Self-pay

## 2016-11-11 ENCOUNTER — Telehealth: Payer: Self-pay

## 2016-11-11 MED ORDER — EPINEPHRINE 0.3 MG/0.3ML IJ SOAJ: 0.3000 mg | Freq: Once | INTRAMUSCULAR | 1 refills | Status: DC

## 2016-11-11 MED ORDER — BUDESONIDE-FORMOTEROL FUMARATE 160-4.5 MCG/ACT IN AERO: 2.0000 | INHALATION_SPRAY | Freq: Two times a day (BID) | RESPIRATORY_TRACT | 5 refills | Status: DC

## 2016-11-11 MED ORDER — BUDESONIDE 32 MCG/ACT NA SUSP: NASAL | 5 refills | Status: DC

## 2016-11-11 NOTE — Telephone Encounter (Signed)
Open In error. 

## 2016-11-11 NOTE — Addendum Note (Signed)
Addended by: Marthann SchillerLIPFORD, Keanthony Poole C on: 11/11/2016 09:24 AM   Modules accepted: Orders

## 2016-11-11 NOTE — Telephone Encounter (Signed)
Called [facility] and spoke with Ave Filterhandler to call in patients Rhinocort, and Epi-Pen 0.3mg . For some reason [facility] cannot except the meds electronically.

## 2016-11-11 NOTE — Addendum Note (Signed)
Addended by: Marthann SchillerLIPFORD, Kinser Fellman C on: 11/11/2016 09:22 AM   Modules accepted: Orders

## 2016-11-11 NOTE — Telephone Encounter (Signed)
Called [facility] to call in script for Symbicort 160 2 puffs twice a day with spacer. I spoke with Ave Filterhandler from the pharmacy, informed her that the Symbicort took place of the Flovent and Montelukast.

## 2016-11-11 NOTE — Telephone Encounter (Signed)
Called patient,informed her that I put doctors note for work, signed consent papers for allergy injections,signed emergency action plan, and appointment card for her first injection is in an envelope for her to pick up at front.

## 2016-11-11 NOTE — Telephone Encounter (Signed)
Patient came in to discuss medication change,and allergy injections. Patient came into [facility] on Tuesday 11/09/2016 and seen Dr.Bobbitt. She left the visit with the regimen of a Flovent 110 2 puffs twice a day, montelukast daily, levocetirizine nightly,albuterol 1-2 puffs every 4-6 hours as needed, Rhinocort AQ 1 spray per nostril 1-2 times a day,and triamcinolone 0.1% cream 1 application sparingly to affected areas twice daily as needed. She called Wednesday 11/10/2016 stated that her montelukast was not making her feel right,where she couldn't do her job. I sent Dr.Bobbitt message in regards to the medication and how it made her feel. Dr.Bobbitt replied and said for Linzi to stop taking Montelukast and Flovent. To let her know we are putting her on Symbicort 160 2 puffs twice a day.Patient still didn't understand,therefore when she came in to fill out consent for allergy injections we discussed medications. I had everything written down on a sheet of paper with all the medication and how she is suppose to take them. I explained that the Symbicort was taken the place of the Flovent and Monetlukast. Then I proceed to go over how the allergy injections and consent.After discussing immunotherapy,i showed her how to use an epi-pen. She seemed to have a better understanding of the medications and how to use them.

## 2016-11-15 ENCOUNTER — Telehealth: Payer: Self-pay | Admitting: Allergy and Immunology

## 2016-11-15 NOTE — Telephone Encounter (Signed)
Pt called to see if she could take Xyzal 5 mg during the day and at night. Because her she said it wears off before time to take another one. [phone].

## 2016-11-15 NOTE — Telephone Encounter (Signed)
Patient called back I informed her that can have Xyzal in the morning and at night when needed not daily per,Dr.Bobbitt.

## 2016-11-15 NOTE — Telephone Encounter (Signed)
Called patient,left message to call back in regards to medication. Patient can take Xyzal as needed for the itching twice a day but not daily.

## 2016-11-17 ENCOUNTER — Encounter: Payer: Self-pay | Admitting: *Deleted

## 2016-11-18 DIAGNOSIS — J301 Allergic rhinitis due to pollen: Secondary | ICD-10-CM | POA: Diagnosis not present

## 2016-11-19 DIAGNOSIS — J3089 Other allergic rhinitis: Secondary | ICD-10-CM

## 2016-11-24 ENCOUNTER — Telehealth: Payer: Self-pay | Admitting: Allergy and Immunology

## 2016-11-24 NOTE — Telephone Encounter (Signed)
patient has called stating she is having issues with her LEVOCETIRIZINE. She has been on the medication for about three weeks The past three days it has increased her anxiety - she cant concentrate - she feels as though she is in a cloud Is this normal? Is there another medication she can take? Please call patient to answer any questions

## 2016-11-24 NOTE — Telephone Encounter (Signed)
He would be unusual for levocetirizine caused anxiety, however a small percentage of individuals do feel some degree of somnolence from this medication.  She can try fexofenadine (Allegra) 180 mg daily as needed.  Fexofenadine cannot cause sedation, drowsiness, anxiety, etc. because it is unable to cross into the central nervous system.

## 2016-11-24 NOTE — Telephone Encounter (Signed)
Spoke to patient advised as written below. Patient verbalizes understanding

## 2016-11-24 NOTE — Telephone Encounter (Signed)
Please advise 

## 2016-11-27 ENCOUNTER — Encounter (HOSPITAL_COMMUNITY): Payer: Self-pay | Admitting: Emergency Medicine

## 2016-11-27 ENCOUNTER — Emergency Department (HOSPITAL_COMMUNITY)
Admission: EM | Admit: 2016-11-27 | Discharge: 2016-11-28 | Disposition: A | Discharge status: Home / Self Care | Payer: BLUE CROSS/BLUE SHIELD | Attending: Emergency Medicine | Admitting: Emergency Medicine

## 2016-11-27 DIAGNOSIS — R21 Rash and other nonspecific skin eruption: Secondary | ICD-10-CM | POA: Diagnosis present

## 2016-11-27 DIAGNOSIS — T7840XA Allergy, unspecified, initial encounter: Secondary | ICD-10-CM | POA: Diagnosis not present

## 2016-11-27 DIAGNOSIS — Z79899 Other long term (current) drug therapy: Secondary | ICD-10-CM | POA: Diagnosis not present

## 2016-11-27 DIAGNOSIS — F17210 Nicotine dependence, cigarettes, uncomplicated: Secondary | ICD-10-CM | POA: Diagnosis not present

## 2016-11-27 DIAGNOSIS — D690 Allergic purpura: Secondary | ICD-10-CM | POA: Insufficient documentation

## 2016-11-27 LAB — CBC WITH DIFFERENTIAL/PLATELET
Basophils Absolute: 0 10*3/uL (ref 0.0–0.1)
Basophils Relative: 0 %
EOS ABS: 0.3 10*3/uL (ref 0.0–0.7)
EOS PCT: 3 %
HCT: 38 % (ref 36.0–46.0)
Hemoglobin: 13.1 g/dL (ref 12.0–15.0)
LYMPHS ABS: 3.2 10*3/uL (ref 0.7–4.0)
Lymphocytes Relative: 38 %
MCH: 32.5 pg (ref 26.0–34.0)
MCHC: 34.5 g/dL (ref 30.0–36.0)
MCV: 94.3 fL (ref 78.0–100.0)
MONO ABS: 0.5 10*3/uL (ref 0.1–1.0)
MONOS PCT: 6 %
Neutro Abs: 4.3 10*3/uL (ref 1.7–7.7)
Neutrophils Relative %: 53 %
PLATELETS: 138 10*3/uL — AB (ref 150–400)
RBC: 4.03 MIL/uL (ref 3.87–5.11)
RDW: 13.1 % (ref 11.5–15.5)
WBC: 8.2 10*3/uL (ref 4.0–10.5)

## 2016-11-27 LAB — COMPREHENSIVE METABOLIC PANEL
ALT: 31 U/L (ref 14–54)
ANION GAP: 6 (ref 5–15)
AST: 31 U/L (ref 15–41)
Albumin: 3.8 g/dL (ref 3.5–5.0)
Alkaline Phosphatase: 53 U/L (ref 38–126)
BUN: 12 mg/dL (ref 6–20)
CHLORIDE: 106 mmol/L (ref 101–111)
CO2: 26 mmol/L (ref 22–32)
Calcium: 8.6 mg/dL — ABNORMAL LOW (ref 8.9–10.3)
Creatinine, Ser: 0.57 mg/dL (ref 0.44–1.00)
Glucose, Bld: 92 mg/dL (ref 65–99)
Potassium: 3.9 mmol/L (ref 3.5–5.1)
SODIUM: 138 mmol/L (ref 135–145)
Total Bilirubin: 0.4 mg/dL (ref 0.3–1.2)
Total Protein: 6.4 g/dL — ABNORMAL LOW (ref 6.5–8.1)

## 2016-11-27 LAB — URINALYSIS, ROUTINE W REFLEX MICROSCOPIC
Bilirubin Urine: NEGATIVE
Glucose, UA: NEGATIVE mg/dL
Hgb urine dipstick: NEGATIVE
Ketones, ur: NEGATIVE mg/dL
LEUKOCYTES UA: NEGATIVE
NITRITE: NEGATIVE
PH: 6 (ref 5.0–8.0)
Protein, ur: NEGATIVE mg/dL
SPECIFIC GRAVITY, URINE: 1.008 (ref 1.005–1.030)

## 2016-11-27 NOTE — ED Notes (Signed)
Pt presenting with dizziness, hoarseness and a rash to both lower legs. Every year when pollen starts, these symptoms present. No change of detergent.

## 2016-11-27 NOTE — ED Triage Notes (Signed)
Pt states she has a rash on her legs that is burning and stinging  Pt states she has had it before but it comes and goes  Pt states she also has problems with allergies and has been on allergy medication for the past 3 weeks  Pt states she is hoarse feels dizzy and light headed and just feels bad in general

## 2016-11-28 MED ORDER — PREDNISONE 10 MG PO TABS: ORAL_TABLET | ORAL | 0 refills | Status: DC

## 2016-11-28 MED ORDER — METHYLPREDNISOLONE SODIUM SUCC 125 MG IJ SOLR
125.0000 mg | Freq: Once | INTRAMUSCULAR | Status: AC
  Administered 2016-11-28: 125 mg via INTRAMUSCULAR
  Filled 2016-11-28: qty 2

## 2016-11-28 NOTE — ED Provider Notes (Signed)
[facility]-EMERGENCY DEPT Provider Note   CSN: [id number] Arrival date & time: 11/27/16  2204     History   Chief Complaint Chief Complaint  Patient presents with  . Rash  . Dizziness    HPI Makayla Reid is a 46 y.o. female.  The history is provided by the patient. No language interpreter was used.  Rash   This is a new problem. The current episode started more than 2 days ago. The problem has not changed since onset.The problem is associated with nothing. There has been no fever. The rash is present on the right lower leg and left lower leg. The pain is moderate. The pain has been constant since onset. She has tried nothing for the symptoms. The treatment provided no relief.  Dizziness   Pt reports she has allergies.  Pt is suppose to start allergy shots.  Pt reports worse than usual.  No relief with medications.  Pt complains of a red raised to lower legs. Pt reports this has happened in the past.  Past Medical History:  Diagnosis Date  . Reinke's edema of vocal folds   . Tobacco abuse disorder 07/13/2016  . Upper respiratory infection 07/13/2016    Patient Active Problem List   Diagnosis Date Noted  . Perennial and seasonal allergic rhinitis 11/09/2016  . Moderate persistent asthma 11/09/2016  . Allergic conjunctivitis 11/09/2016  . Dermatitis 11/09/2016  . Upper respiratory infection 07/13/2016  . Tobacco abuse disorder 07/13/2016  . Depression 03/31/2016  . Chronic obstructive pulmonary disease (HCC) 03/31/2016  . HA (headache) 05/19/2013    Past Surgical History:  Procedure Laterality Date  . ABDOMINAL HYSTERECTOMY    . CHOLECYSTECTOMY    . MICROLARYNGOSCOPY    . TONSILLECTOMY    . TUBAL LIGATION      OB History    No data available       Home Medications    Prior to Admission medications   Medication Sig Start Date End Date Taking? Authorizing Provider  albuterol (PROAIR HFA) 108 (90 Base) MCG/ACT inhaler Inhale 2 puffs into the lungs every 4 (four)  hours as needed for wheezing or shortness of breath. 11/09/16   Cristal Ford, MD  ALPRAZolam Prudy Feeler) 1 MG tablet Take 1 tablet (1 mg total) by mouth 3 (three) times daily as needed for anxiety or sleep. Office visit needed 07/19/16   Gwenlyn Found Copland, MD  budesonide (RHINOCORT AQUA) 32 MCG/ACT nasal spray 1 spray per nostril 1-2 times daily as needed. 11/11/16   Cristal Ford, MD  budesonide-formoterol [facility]) 160-4.5 MCG/ACT inhaler Inhale 2 puffs into the lungs 2 (two) times daily. With spacer 11/11/16   Cristal Ford, MD  EPINEPHrine (EPIPEN 2-PAK) 0.3 mg/0.3 mL IJ SOAJ injection Inject 0.3 mLs (0.3 mg total) into the muscle once. 11/11/16 11/11/16  Cristal Ford, MD  fluticasone (FLOVENT HFA) 110 MCG/ACT inhaler Inhale 2 puffs into the lungs 2 (two) times daily. 11/09/16   Cristal Ford, MD  levocetirizine (XYZAL) 5 MG tablet Take 1 tablet (5 mg total) by mouth every evening. 11/09/16   Cristal Ford, MD  montelukast (SINGULAIR) 10 MG tablet Take 1 tablet (10 mg total) by mouth at bedtime. 11/09/16   Cristal Ford, MD  Multiple Vitamin (THERA) TABS Take by mouth.    Historical Provider, MD  Omega-3 1000 MG CAPS Take 1 g by mouth.    Historical Provider, MD  triamcinolone ointment (KENALOG) 0.1 % Apply 1 application topically 2 (two)  times daily as needed. Use sparingly to affected areas on the body (below the face/neck) twice daily as needed. 11/09/16   Cristal Ford, MD    Family History Family History  Problem Relation Age of Onset  . Allergic rhinitis Mother   . Allergic rhinitis Father   . Allergic rhinitis Brother   . Angioedema Neg Hx   . Asthma Neg Hx   . Atopy Neg Hx   . Eczema Neg Hx   . Immunodeficiency Neg Hx   . Urticaria Neg Hx     Social History Social History  Substance Use Topics  . Smoking status: Current Every Day Smoker    Packs/day: 0.50    Types: Cigarettes  . Smokeless tobacco: Never Used  . Alcohol use No      Comment: socially     Allergies   Penicillins   Review of Systems Review of Systems  Skin: Positive for rash.  Neurological: Positive for dizziness.  All other systems reviewed and are negative.    Physical Exam Updated Vital Signs BP 125/85 (BP Location: Left Arm)   Pulse (!) 59   Temp 97.9 F (36.6 C) (Oral)   Resp 18   Ht  (1.626 m)   Wt 47.2 kg   LMP 04/02/2014 Comment: tubal ligation  SpO2 100%   BMI 17.85 kg/m   Physical Exam  Constitutional: She appears well-developed and well-nourished.  HENT:  Head: Normocephalic.  Right Ear: External ear normal.  Left Ear: External ear normal.  Mouth/Throat: Oropharynx is clear and moist.  Eyes: Conjunctivae and EOM are normal. Pupils are equal, round, and reactive to light.  Neck: Normal range of motion.  Cardiovascular: Normal rate.   Pulmonary/Chest: Effort normal.  Abdominal: Soft.  Neurological: She is alert.  Skin: There is erythema.  Erythematous, red slightly raised rash, no blanching. (looks like vasculitis)   Psychiatric: She has a normal mood and affect.  Nursing note and vitals reviewed.    ED Treatments / Results  Labs (all labs ordered are listed, but only abnormal results are displayed) Labs Reviewed  CBC WITH DIFFERENTIAL/PLATELET - Abnormal; Notable for the following:       Result Value   Platelets 138 (*)    All other components within normal limits  COMPREHENSIVE METABOLIC PANEL - Abnormal; Notable for the following:    Calcium 8.6 (*)    Total Protein 6.4 (*)    All other components within normal limits  URINALYSIS, ROUTINE W REFLEX MICROSCOPIC    EKG  EKG Interpretation None       Radiology No results found.  Procedures Procedures (including critical care time)  Medications Ordered in ED Medications - No data to display   Initial Impression / Assessment and Plan / ED Course  I have reviewed the triage vital signs and the nursing notes.  Pertinent labs &  imaging results that were available during my care of the patient were reviewed by me and considered in my medical decision making (see chart for details).     Pt given solumedrol IM.  Prednisone taper.  Follow up with allergist  As scheduled  Final Clinical Impressions(s) / ED Diagnoses   Final diagnoses:  Vasculitis, allergic purpura (HCC)  Allergic reaction, initial encounter    New Prescriptions New Prescriptions   PREDNISONE (DELTASONE) 10 MG TABLET    6,5,4,3,2,1 taper  An After Visit Summary was printed and given to the patient.    Elson Areas, PA-C 11/28/16 0025  Pricilla Loveless, MD 11/28/16 [phone]

## 2016-11-28 NOTE — Discharge Instructions (Signed)
See the Allergist as scheduled.  Your platelets are slightly low and need to be rechecked.  The rash on your legs appears to be an allergic vasculitis.

## 2016-11-29 ENCOUNTER — Telehealth: Payer: Self-pay | Admitting: Allergy and Immunology

## 2016-11-29 ENCOUNTER — Ambulatory Visit: Payer: BLUE CROSS/BLUE SHIELD

## 2016-11-29 NOTE — Telephone Encounter (Signed)
I called the patient. If the lesions appeared to be related to vasculitis to the ER physician, she will need skin biopsy of an active lesion if possible. I have recommended seeing a dermatologist to do the biopsy to rule out vasculitis - if it is vasculitis she will need to be referred to a rheumatologist. She described the lesions as firey red, painful, and fade to purple. I answered her questions to her satisfaction. She will be referred to a dermatologist by her PCP Dr. Larina Bras.

## 2016-11-29 NOTE — Telephone Encounter (Signed)
Patient went to the ER Saturday after work due to a rash Allergic vascullitis?? was given prednisone  taper 6 pills yesterday Took 5 pills today Patient was told that she needed allergy shots, but to get them after she stops this med - patient was also told her platletes were low and needed to be retested

## 2016-11-29 NOTE — Telephone Encounter (Signed)
Please advise on next step. Thank you.

## 2016-12-24 ENCOUNTER — Telehealth: Payer: Self-pay | Admitting: Allergy and Immunology

## 2016-12-24 NOTE — Telephone Encounter (Signed)
I personally dropped off Makayla Reid's vials- all levels to [facility] in [place]. The vials and prescriptions were given to Dr. Mikey BussingHoffman and her nurse. Dr. Neita GarnetHoffman's nurse stated she would call Meliya and inform her that her vials were received and that she could start injections.

## 2016-12-24 NOTE — Telephone Encounter (Signed)
I received a return call from McKessonammy, Engineer, manufacturingractice Manager at [facility]. She informed me they would accept the transfer of serum/ vials for Makayla Reid. I scheduled the drop off for tomorrow in their [place] office.

## 2016-12-24 NOTE — Telephone Encounter (Signed)
Makayla Reid called back talking extremely loud about her insulin injections. Makayla Reid transferred the call to me. It took several minutes to ensure that Makayla Reid was in fact speaking of her allergy injections and not insulin. My fear was she was a diabetic and was confused. Finally, I clarified that she was confusing inulin with allergy injections. I reiterated that I would speak to [facility] and see if we can get her vials transferred to them to avoid an extra cost being incurred by the patient

## 2016-12-24 NOTE — Telephone Encounter (Signed)
Makayla Reid called the office to let us know she wanted to transfer her care to [facility]. She stated she had already been see by Dr. Mikey BussingHoffman and [place] allergy [place] [facility]. I told her I would look into this process and call her back

## 2017-01-11 ENCOUNTER — Ambulatory Visit: Payer: BLUE CROSS/BLUE SHIELD | Admitting: Allergy and Immunology

## 2017-02-22 NOTE — Progress Notes (Addendum)
[facility] [address] [place], [place] [address] [phone]  Date:  02/23/2017   Name:  Makayla Reid   DOB:  June 19, 1971   MRN:  [id number]  PCP:  Pearline Cables, MD    Chief Complaint: Annual Exam (Pt here for CPE with PAP. )   History of Present Illness:  Makayla Reid is a 46 y.o. very pleasant female patient who presents with the following:  History of asthma and allergies Last seen by myself last October with a COPD exacerbation We estimate that her last pap was 2.5- 3 years ago  Her hysterectomy was done in 2015- she had fibroids.  Never had any GYN cancers She has never had an abnl pap Wt Readings from Last 3 Encounters:  02/23/17 105 lb (47.6 kg)  11/27/16 104 lb (47.2 kg)  11/09/16 99 lb 6.4 oz (45.1 kg)   She could also use a cholesterol panel   She did have just one donut today She does have seasonal allergies which have bothered her quite a bit this year as well  Patient Active Problem List   Diagnosis Date Noted  . Perennial and seasonal allergic rhinitis 11/09/2016  . Moderate persistent asthma 11/09/2016  . Allergic conjunctivitis 11/09/2016  . Dermatitis 11/09/2016  . Tobacco abuse disorder 07/13/2016  . Depression 03/31/2016  . Chronic obstructive pulmonary disease (HCC) 03/31/2016  . HA (headache) 05/19/2013    Past Medical History:  Diagnosis Date  . Reinke's edema of vocal folds   . Tobacco abuse disorder 07/13/2016  . Upper respiratory infection 07/13/2016    Past Surgical History:  Procedure Laterality Date  . ABDOMINAL HYSTERECTOMY    . CHOLECYSTECTOMY    . MICROLARYNGOSCOPY    . TONSILLECTOMY    . TUBAL LIGATION      Social History  Substance Use Topics  . Smoking status: Current Every Day Smoker    Packs/day: 0.50    Types: Cigarettes  . Smokeless tobacco: Never Used  . Alcohol use No     Comment: socially    Family History  Problem Relation Age  of Onset  . Allergic rhinitis Mother   . Allergic rhinitis Father   . Allergic rhinitis Brother   . Angioedema Neg Hx   . Asthma Neg Hx   . Atopy Neg Hx   . Eczema Neg Hx   . Immunodeficiency Neg Hx   . Urticaria Neg Hx     Allergies  Allergen Reactions  . Penicillins Rash    Medication list has been reviewed and updated.  Current Outpatient Prescriptions on File Prior to Visit  Medication Sig Dispense Refill  . ALPRAZolam (XANAX) 1 MG tablet Take 1 tablet (1 mg total) by mouth 3 (three) times daily as needed for anxiety or sleep. Office visit needed 90 tablet 0  . Multiple Vitamin (THERA) TABS Take by mouth.    . Omega-3 1000 MG CAPS Take 1 g by mouth.    . EPINEPHrine (EPIPEN 2-PAK) 0.3 mg/0.3 mL IJ SOAJ injection Inject 0.3 mLs (0.3 mg total) into the muscle once. 2 Device 1   No current facility-administered medications on file prior to visit.     Review of Systems:  As per HPI- otherwise negative.   Physical Examination: Vitals:   02/23/17 1304  BP: 108/73  Pulse: 70  Temp: 98.6 F (37 C)   Vitals:   02/23/17 1304  Weight: 105 lb (47.6  kg)  Height: 5' 3.75" (1.619 m)   Body mass index is 18.16 kg/m. Ideal Body Weight: Weight in (lb) to have BMI = 25: 144.2  GEN: WDWN, NAD, Non-toxic, A & O x 3,thin build, very tan HEENT: Atraumatic, Normocephalic. Neck supple. No masses, No LAD. Ears and Nose: No external deformity. CV: RRR, No M/G/R. No JVD. No thrill. No extra heart sounds. PULM: CTA B, no wheezes, crackles, rhonchi. No retractions. No resp. distress. No accessory muscle use. ABD: S, NT, ND, +BS. No rebound. No HSM. EXTR: No c/c/e NEURO Normal gait.  PSYCH: Normally interactive. Conversant. Not depressed or anxious appearing.  Calm demeanor.  Breast: normal exam, no masses/ dimpling/ discharge Pelvic: normal, no vaginal lesions or discharge. S/p hysterectomy, cervix has been removed, no adnexal tendereness or masses  Assessment and  Plan: Screening for cervical cancer - Plan: Cytology - PAP  Screening for hyperlipidemia - Plan: Lipid panel  Underweight - Plan: Basic metabolic panel, TSH  Pap for her today- advised that she no longer has a cervix so she does not have to continue paps unless she would like to.  Given her young age could do a pap every few years Also update FLP, BMP and TSH today  Will plan further follow- up pending labs.   Signed Abbe AmsterdamJessica Guage Efferson, MD  Results for orders placed or performed in visit on 02/23/17  Basic metabolic panel  Result Value Ref Range   Sodium 139 135 - 145 mEq/L   Potassium 4.7 3.5 - 5.1 mEq/L   Chloride 105 96 - 112 mEq/L   CO2 30 19 - 32 mEq/L   Glucose, Bld 107 (H) 70 - 99 mg/dL   BUN 12 6 - 23 mg/dL   Creatinine, Ser 4.090.78 0.40 - 1.20 mg/dL   Calcium 9.9 8.4 - 81.110.5 mg/dL   GFR 91.4783.66 >82.95>60.00 mL/min  Lipid panel  Result Value Ref Range   Cholesterol 169 0 - 200 mg/dL   Triglycerides 62.184.0 0.0 - 149.0 mg/dL   HDL 30.8671.10 >57.84>39.00 mg/dL   VLDL 69.616.8 0.0 - 29.540.0 mg/dL   LDL Cholesterol 81 0 - 99 mg/dL   Total CHOL/HDL Ratio 2    NonHDL 97.54   TSH  Result Value Ref Range   TSH 1.20 0.35 - 4.50 uIU/mL  Cytology - PAP  Result Value Ref Range   Adequacy Satisfactory for evaluation.    Diagnosis      NEGATIVE FOR INTRAEPITHELIAL LESIONS OR MALIGNANCY.   HPV NOT DETECTED    Material Submitted Vaginal Pap [ThinPrep Imaged]    CYTOLOGY - PAP PAP RESULT    Letter to pt

## 2017-02-23 ENCOUNTER — Ambulatory Visit (INDEPENDENT_AMBULATORY_CARE_PROVIDER_SITE_OTHER): Payer: BLUE CROSS/BLUE SHIELD | Admitting: Family Medicine

## 2017-02-23 ENCOUNTER — Other Ambulatory Visit (HOSPITAL_COMMUNITY)
Admission: RE | Admit: 2017-02-23 | Discharge: 2017-02-23 | Disposition: A | Discharge status: Home / Self Care | Payer: BLUE CROSS/BLUE SHIELD | Source: Ambulatory Visit | Attending: Family Medicine | Admitting: Family Medicine

## 2017-02-23 VITALS — BP 108/73 | HR 70 | Temp 98.6°F | Ht 63.75 in | Wt 105.0 lb

## 2017-02-23 DIAGNOSIS — Z124 Encounter for screening for malignant neoplasm of cervix: Secondary | ICD-10-CM

## 2017-02-23 DIAGNOSIS — Z13220 Encounter for screening for lipoid disorders: Secondary | ICD-10-CM | POA: Diagnosis not present

## 2017-02-23 DIAGNOSIS — R636 Underweight: Secondary | ICD-10-CM | POA: Diagnosis not present

## 2017-02-23 NOTE — Patient Instructions (Addendum)
It was great to see you today!  Take care and I will be in touch with your labs and pap Since you have had your cervix removed you do not necessarily need to continue to have pap smears,  However if you prefer to have one every few years this is also ok

## 2017-02-24 LAB — TSH: TSH: 1.2 u[IU]/mL (ref 0.35–4.50)

## 2017-02-24 LAB — BASIC METABOLIC PANEL
BUN: 12 mg/dL (ref 6–23)
CALCIUM: 9.9 mg/dL (ref 8.4–10.5)
CO2: 30 mEq/L (ref 19–32)
Chloride: 105 mEq/L (ref 96–112)
Creatinine, Ser: 0.78 mg/dL (ref 0.40–1.20)
GFR: 83.66 mL/min (ref >60.00)
GLUCOSE: 107 mg/dL — AB (ref 70–99)
POTASSIUM: 4.7 meq/L (ref 3.5–5.1)
SODIUM: 139 meq/L (ref 135–145)

## 2017-02-24 LAB — LIPID PANEL
CHOLESTEROL: 169 mg/dL (ref 0–200)
HDL: 71.1 mg/dL (ref >39.00)
LDL Cholesterol: 81 mg/dL (ref 0–99)
NONHDL: 97.54
Total CHOL/HDL Ratio: 2
Triglycerides: 84 mg/dL (ref 0.0–149.0)
VLDL: 16.8 mg/dL (ref 0.0–40.0)

## 2017-02-24 LAB — CYTOLOGY - PAP
Diagnosis: NEGATIVE
HPV: NOT DETECTED

## 2017-02-28 ENCOUNTER — Telehealth: Payer: Self-pay | Admitting: Family Medicine

## 2017-02-28 NOTE — Telephone Encounter (Addendum)
Patient notified

## 2017-02-28 NOTE — Telephone Encounter (Signed)
Self   Pt called in for lab results. She would like a call back at: [phone]

## 2017-03-11 ENCOUNTER — Telehealth: Payer: Self-pay | Admitting: Emergency Medicine

## 2017-03-11 NOTE — Telephone Encounter (Signed)
-----   Message from Tylene FantasiaAshlee S Langston, RN sent at 03/07/2017  1:17 PM EDT ----- Per Dr. Joaquim LaiWendling---pap smear normal. Recheck in 3 years.

## 2017-03-11 NOTE — Telephone Encounter (Signed)
Called pt to inform her of her pap smear results. Pt states that she is still having right ovary pain that is severe. Advised pt to go to her nearest urgent care or hospital. Pt verbalized understanding and will go today.

## 2017-04-05 NOTE — Progress Notes (Signed)
[facility] [address] [place], [place] [address] [phone]  Date:  04/06/2017   Name:  Makayla Reid   DOB:  1971/06/09   MRN:  [id number]  PCP:  Pearline Cables, MD    Chief Complaint: Back Pain (lower back pain that "shoots to pelvic area" onset about 3 or 4 months ago, but states it's getting worse.) and Urinary Tract Infection (Pt states that there's so much pain when she uses the restroom it causes her to vomit. )   History of Present Illness:  Makayla Reid is a 46 y.o. very pleasant female patient who presents with the following:  History of COPD, here today with concern of possible UTI vs other cause of pelvic pain I saw her about a month ago for a pap and physical. She had a normal pelvic exam at last visit She has noted right lower back pain and right lower quadrant for the last few months- she had thought that her pap would address this, I don't recall [place] talking about this however at our last visit  Today she relates a recent history of severe episodic RLQ pain sometimes associated with vomiting.  This more recently occurred this morning - she had severe pain, vomited twice.  She thought about going to the ER but came here instead.  She may have pain severe enough to cause her to vomit maybe once a week  There is a family history of renal failure Normal renal function in July  She is s/p hysterectomy  She has been married for 29 years- no new sexual partners Never had a kidney stone She still does have her appendix   Patient Active Problem List   Diagnosis Date Noted  . Perennial and seasonal allergic rhinitis 11/09/2016  . Moderate persistent asthma 11/09/2016  . Allergic conjunctivitis 11/09/2016  . Dermatitis 11/09/2016  . Tobacco abuse disorder 07/13/2016  . Depression 03/31/2016  . Chronic obstructive pulmonary disease (HCC) 03/31/2016  . HA (headache) 05/19/2013    Past Medical  History:  Diagnosis Date  . Kidney infection   . Reinke's edema of vocal folds   . Tobacco abuse disorder 07/13/2016  . Upper respiratory infection 07/13/2016    Past Surgical History:  Procedure Laterality Date  . ABDOMINAL HYSTERECTOMY    . CHOLECYSTECTOMY    . MICROLARYNGOSCOPY    . TONSILLECTOMY    . TUBAL LIGATION      Social History  Substance Use Topics  . Smoking status: Current Every Day Smoker    Packs/day: 0.50    Types: Cigarettes  . Smokeless tobacco: Never Used  . Alcohol use No     Comment: socially    Family History  Problem Relation Age of Onset  . Allergic rhinitis Mother   . Kidney disease Mother   . Allergic rhinitis Father   . Allergic rhinitis Brother   . Kidney disease Maternal Uncle   . Angioedema Neg Hx   . Asthma Neg Hx   . Atopy Neg Hx   . Eczema Neg Hx   . Immunodeficiency Neg Hx   . Urticaria Neg Hx     Allergies  Allergen Reactions  . Penicillins Rash    Medication list has been reviewed and updated.  Current Outpatient Prescriptions on File Prior to Visit  Medication Sig Dispense Refill  . ALPRAZolam (XANAX) 1 MG tablet Take 1 tablet (1 mg total) by mouth 3 (three)  times daily as needed for anxiety or sleep. Office visit needed 90 tablet 0  . cetirizine (ZYRTEC) 10 MG tablet Take 10 mg by mouth daily.    . Multiple Vitamin (THERA) TABS Take by mouth.    . Omega-3 1000 MG CAPS Take 1 g by mouth.    . EPINEPHrine (EPIPEN 2-PAK) 0.3 mg/0.3 mL IJ SOAJ injection Inject 0.3 mLs (0.3 mg total) into the muscle once. 2 Device 1   No current facility-administered medications on file prior to visit.     Review of Systems:  As per HPI- otherwise negative.   Physical Examination: Vitals:   04/06/17 1534  BP: 117/72  Pulse: 63  Temp: 98.3 F (36.8 C)  SpO2: 98%   Vitals:   04/06/17 1534  Weight: 105 lb (47.6 kg)  Height: 5\' 3"  (1.6 m)   Body mass index is 18.6 kg/m. Ideal Body Weight: Weight in (lb) to have BMI = 25:  140.8  GEN: WDWN, NAD, Non-toxic, A & O x 3 HEENT: Atraumatic, Normocephalic. Neck supple. No masses, No LAD. Ears and Nose: No external deformity. CV: RRR, No M/G/R. No JVD. No thrill. No extra heart sounds. PULM: CTA B, no wheezes, crackles, rhonchi. No retractions. No resp. distress. No accessory muscle use. ABD: S, ND, +BS. No rebound. No HSM.  She notes minimal right lower back and RLQ tenderness to palpation  EXTR: No c/c/e NEURO Normal gait.  PSYCH: Normally interactive. Conversant. Not depressed or anxious appearing.  Calm demeanor.  Look well, thin build, smoker  Results for orders placed or performed in visit on 04/06/17  POCT urinalysis dipstick  Result Value Ref Range   Color, UA straw (A) yellow   Clarity, UA clear clear   Glucose, UA negative negative mg/dL   Bilirubin, UA negative negative   Ketones, POC UA negative negative mg/dL   Spec Grav, UA 4.098 1.191 - 1.025   Blood, UA negative negative   pH, UA 6.0 5.0 - 8.0   Protein Ur, POC negative negative mg/dL   Urobilinogen, UA 0.2 0.2 or 1.0 E.U./dL   Nitrite, UA Negative Negative   Leukocytes, UA Negative Negative     Assessment and Plan: Dysuria - Plan: Urine Culture, POCT urinalysis dipstick  Urinary pain - Plan: CANCELED: POCT Urinalysis Dipstick  Right lower quadrant pain  Here today with right lower back/ RLQ pain which has been episodic for several months.  Sometimes it is so severe that she likens it to labor pains.  She had an episode of such severe pain this am and vomited twice Urine culture pending.   Explained to her that she likely needs imaging and further labs to address her concerns today.  She would like to go to the ER for evaluation downstairs. Appreciate ER care of this nice lady   Signed Abbe Amsterdam, MD

## 2017-04-06 ENCOUNTER — Emergency Department (HOSPITAL_BASED_OUTPATIENT_CLINIC_OR_DEPARTMENT_OTHER)
Admission: EM | Admit: 2017-04-06 | Discharge: 2017-04-06 | Disposition: A | Discharge status: Home / Self Care | Payer: BLUE CROSS/BLUE SHIELD | Attending: Emergency Medicine | Admitting: Emergency Medicine

## 2017-04-06 ENCOUNTER — Encounter: Payer: Self-pay | Admitting: Family Medicine

## 2017-04-06 ENCOUNTER — Ambulatory Visit (INDEPENDENT_AMBULATORY_CARE_PROVIDER_SITE_OTHER): Payer: BLUE CROSS/BLUE SHIELD | Admitting: Family Medicine

## 2017-04-06 ENCOUNTER — Encounter (HOSPITAL_BASED_OUTPATIENT_CLINIC_OR_DEPARTMENT_OTHER): Payer: Self-pay

## 2017-04-06 ENCOUNTER — Emergency Department (HOSPITAL_BASED_OUTPATIENT_CLINIC_OR_DEPARTMENT_OTHER): Payer: BLUE CROSS/BLUE SHIELD

## 2017-04-06 VITALS — BP 117/72 | HR 63 | Temp 98.3°F | Ht 63.0 in | Wt 105.0 lb

## 2017-04-06 DIAGNOSIS — R1031 Right lower quadrant pain: Secondary | ICD-10-CM | POA: Diagnosis not present

## 2017-04-06 DIAGNOSIS — R300 Dysuria: Secondary | ICD-10-CM | POA: Diagnosis not present

## 2017-04-06 DIAGNOSIS — J449 Chronic obstructive pulmonary disease, unspecified: Secondary | ICD-10-CM | POA: Diagnosis not present

## 2017-04-06 DIAGNOSIS — F17210 Nicotine dependence, cigarettes, uncomplicated: Secondary | ICD-10-CM | POA: Diagnosis not present

## 2017-04-06 DIAGNOSIS — R309 Painful micturition, unspecified: Secondary | ICD-10-CM | POA: Diagnosis not present

## 2017-04-06 LAB — COMPREHENSIVE METABOLIC PANEL
ALK PHOS: 72 U/L (ref 38–126)
ALT: 16 U/L (ref 14–54)
ANION GAP: 8 (ref 5–15)
AST: 22 U/L (ref 15–41)
Albumin: 4.1 g/dL (ref 3.5–5.0)
BUN: 10 mg/dL (ref 6–20)
CALCIUM: 9 mg/dL (ref 8.9–10.3)
CO2: 26 mmol/L (ref 22–32)
Chloride: 106 mmol/L (ref 101–111)
Creatinine, Ser: 0.66 mg/dL (ref 0.44–1.00)
GFR calc non Af Amer: >60 mL/min (ref >60)
Glucose, Bld: 83 mg/dL (ref 65–99)
POTASSIUM: 3.6 mmol/L (ref 3.5–5.1)
SODIUM: 140 mmol/L (ref 135–145)
TOTAL PROTEIN: 7.1 g/dL (ref 6.5–8.1)
Total Bilirubin: 0.7 mg/dL (ref 0.3–1.2)

## 2017-04-06 LAB — URINALYSIS, ROUTINE W REFLEX MICROSCOPIC
BILIRUBIN URINE: NEGATIVE
Glucose, UA: NEGATIVE mg/dL
Hgb urine dipstick: NEGATIVE
KETONES UR: 15 mg/dL — AB
Leukocytes, UA: NEGATIVE
NITRITE: NEGATIVE
Protein, ur: NEGATIVE mg/dL
Specific Gravity, Urine: 1.017 (ref 1.005–1.030)
pH: 5.5 (ref 5.0–8.0)

## 2017-04-06 LAB — CBC WITH DIFFERENTIAL/PLATELET
BASOS ABS: 0 10*3/uL (ref 0.0–0.1)
Basophils Relative: 0 %
EOS ABS: 0.1 10*3/uL (ref 0.0–0.7)
EOS PCT: 2 %
HCT: 43.1 % (ref 36.0–46.0)
Hemoglobin: 14.6 g/dL (ref 12.0–15.0)
Lymphocytes Relative: 34 %
Lymphs Abs: 2.4 10*3/uL (ref 0.7–4.0)
MCH: 31.6 pg (ref 26.0–34.0)
MCHC: 33.9 g/dL (ref 30.0–36.0)
MCV: 93.3 fL (ref 78.0–100.0)
Monocytes Absolute: 0.4 10*3/uL (ref 0.1–1.0)
Monocytes Relative: 6 %
Neutro Abs: 4 10*3/uL (ref 1.7–7.7)
Neutrophils Relative %: 58 %
PLATELETS: 162 10*3/uL (ref 150–400)
RBC: 4.62 MIL/uL (ref 3.87–5.11)
RDW: 12 % (ref 11.5–15.5)
WBC: 7 10*3/uL (ref 4.0–10.5)

## 2017-04-06 LAB — POCT URINALYSIS DIP (MANUAL ENTRY)
Bilirubin, UA: NEGATIVE
Blood, UA: NEGATIVE
Glucose, UA: NEGATIVE mg/dL
Ketones, POC UA: NEGATIVE mg/dL
Leukocytes, UA: NEGATIVE
Nitrite, UA: NEGATIVE
PH UA: 6 (ref 5.0–8.0)
PROTEIN UA: NEGATIVE mg/dL
SPEC GRAV UA: 1.015 (ref 1.010–1.025)
UROBILINOGEN UA: 0.2 U/dL

## 2017-04-06 LAB — LIPASE, BLOOD: Lipase: 49 U/L (ref 11–51)

## 2017-04-06 MED ORDER — SODIUM CHLORIDE 0.9 % IV SOLN
1000.0000 mL | INTRAVENOUS | Status: DC
Start: 2017-04-06 — End: 2017-04-06

## 2017-04-06 MED ORDER — SODIUM CHLORIDE 0.9 % IV BOLUS (SEPSIS)
1000.0000 mL | Freq: Once | INTRAVENOUS | Status: AC
  Administered 2017-04-06: 1000 mL via INTRAVENOUS

## 2017-04-06 MED ORDER — IOPAMIDOL (ISOVUE-300) INJECTION 61%
100.0000 mL | Freq: Once | INTRAVENOUS | Status: AC | PRN
  Administered 2017-04-06: 100 mL via INTRAVENOUS

## 2017-04-06 MED ORDER — MORPHINE SULFATE (PF) 2 MG/ML IV SOLN
2.0000 mg | Freq: Once | INTRAVENOUS | Status: AC
  Administered 2017-04-06: 2 mg via INTRAVENOUS
  Filled 2017-04-06: qty 1

## 2017-04-06 MED ORDER — ONDANSETRON HCL 4 MG/2ML IJ SOLN
4.0000 mg | Freq: Once | INTRAMUSCULAR | Status: AC
  Administered 2017-04-06: 4 mg via INTRAVENOUS

## 2017-04-06 MED ORDER — ONDANSETRON HCL 4 MG/2ML IJ SOLN
INTRAMUSCULAR | Status: AC
  Administered 2017-04-06: 4 mg via INTRAVENOUS
  Filled 2017-04-06: qty 2

## 2017-04-06 MED ORDER — ONDANSETRON HCL 4 MG PO TABS: 4.0000 mg | ORAL_TABLET | Freq: Four times a day (QID) | ORAL | 0 refills | Status: DC

## 2017-04-06 MED ORDER — DICYCLOMINE HCL 20 MG PO TABS: 20.0000 mg | ORAL_TABLET | Freq: Two times a day (BID) | ORAL | 0 refills | Status: DC

## 2017-04-06 NOTE — Discharge Instructions (Signed)
Stay well hydrated with water. Take Bentyl as described. Use Zofran as needed for nausea or vomiting. It is important that you follow up with the GI (gastroenterologist) doctor for further evaluation of your pain. Return to the emergency room if you develop persistent high fevers, persistent vomiting despite medication, bright red or black stools, or any new or worsening symptoms.

## 2017-04-06 NOTE — ED Notes (Signed)
Pt verbalizes understanding of d/c instructions and denies any further needs at this time. 

## 2017-04-06 NOTE — ED Notes (Signed)
Pt in CT.

## 2017-04-06 NOTE — ED Triage Notes (Signed)
C/o RLQ pain x "couple months"-n/v x today-sent from [facility] to r/o appendicitis per pt

## 2017-04-07 LAB — URINE CULTURE: ORGANISM ID, BACTERIA: NO GROWTH

## 2017-04-07 NOTE — ED Provider Notes (Signed)
[facility] Provider Note   CSN: [id number] Arrival date & time: 04/06/17  1626     History   Chief Complaint Chief Complaint  Patient presents with  . Abdominal Pain    HPI Makayla Reid is a 46 y.o. female presenting with 2-3 months intermittent RLQ abd pain.   Pt sent from PCP for appendicitis r/o. Pt has had intermittent abd pain for several months. Pain is always on the lower R side. Pt states she has episodes of very sharp, severe pain, which resolve spontaneously. She will have 1 to many episodes throughout the day. They are not associated with eating, movement, or BMs. Associated sxs including nausea and occasional vomiting due to pain. She has never had similar problem. H/o hysterectomy, but no there abd surgeries. She denies fevers, chill, CP, SOB, abd distention, urinary sxs, or abnl BMs. She denies blood in her stool. She denies h/o UC, crohns, diverticulitis, or obstruction.  HPI  Past Medical History:  Diagnosis Date  . Kidney infection   . Reinke's edema of vocal folds   . Tobacco abuse disorder 07/13/2016  . Upper respiratory infection 07/13/2016    Patient Active Problem List   Diagnosis Date Noted  . Perennial and seasonal allergic rhinitis 11/09/2016  . Moderate persistent asthma 11/09/2016  . Allergic conjunctivitis 11/09/2016  . Dermatitis 11/09/2016  . Tobacco abuse disorder 07/13/2016  . Depression 03/31/2016  . Chronic obstructive pulmonary disease (HCC) 03/31/2016  . HA (headache) 05/19/2013    Past Surgical History:  Procedure Laterality Date  . ABDOMINAL HYSTERECTOMY    . CHOLECYSTECTOMY    . MICROLARYNGOSCOPY    . TONSILLECTOMY    . TUBAL LIGATION      OB History    No data available       Home Medications    Prior to Admission medications   Medication Sig Start Date End Date Taking? Authorizing Provider  ALPRAZolam Prudy Feeler) 1 MG tablet Take 1 tablet (1 mg total) by mouth 3 (three) times daily as needed for anxiety  or sleep. Office visit needed 07/19/16   Copland, Gwenlyn Found, MD  cetirizine (ZYRTEC) 10 MG tablet Take 10 mg by mouth daily.    [provider]  dicyclomine (BENTYL) 20 MG tablet Take 1 tablet (20 mg total) by mouth 2 (two) times daily. 04/06/17   Nasha Diss, PA-C  EPINEPHrine (EPIPEN 2-PAK) 0.3 mg/0.3 mL IJ SOAJ injection Inject 0.3 mLs (0.3 mg total) into the muscle once. 11/11/16 11/11/16  Bobbitt, Heywood Iles, MD  Multiple Vitamin (THERA) TABS Take by mouth.    [provider]  Omega-3 1000 MG CAPS Take 1 g by mouth.    [provider]  ondansetron (ZOFRAN) 4 MG tablet Take 1 tablet (4 mg total) by mouth every 6 (six) hours. 04/06/17   Jacqueline Spofford, PA-C    Family History Family History  Problem Relation Age of Onset  . Allergic rhinitis Mother   . Kidney disease Mother   . Allergic rhinitis Father   . Allergic rhinitis Brother   . Kidney disease Maternal Uncle   . Angioedema Neg Hx   . Asthma Neg Hx   . Atopy Neg Hx   . Eczema Neg Hx   . Immunodeficiency Neg Hx   . Urticaria Neg Hx     Social History Social History  Substance Use Topics  . Smoking status: Current Every Day Smoker    Packs/day: 0.50    Types: Cigarettes  . Smokeless tobacco:  Never Used  . Alcohol use No     Comment: socially     Allergies   Penicillins   Review of Systems Review of Systems  Gastrointestinal: Positive for abdominal pain and vomiting.  All other systems reviewed and are negative.    Physical Exam Updated Vital Signs BP (!) 127/98 (BP Location: Right Arm)   Pulse 66   Temp 98.1 F (36.7 C) (Oral)   Resp 16   Ht 5\' 3"  (1.6 m)   Wt 47.7 kg (105 lb 2.6 oz)   LMP 04/02/2014 Comment: tubal ligation  SpO2 98%   BMI 18.63 kg/m   Physical Exam  Constitutional: She is oriented to person, place, and time. She appears well-developed and well-nourished. No distress.  HENT:  Head: Normocephalic and atraumatic.  Eyes: EOM are normal.  Neck:  Normal range of motion.  Cardiovascular: Normal rate, regular rhythm and intact distal pulses.   Pulmonary/Chest: Effort normal and breath sounds normal. No respiratory distress. She has no wheezes.  Abdominal: Soft. Normal appearance and bowel sounds are normal. She exhibits no distension. There is tenderness in the right lower quadrant. There is no rigidity, no rebound, no guarding, no CVA tenderness, no tenderness at McBurney's point and negative Murphy's sign.  Minimal TTP of RLQ  Musculoskeletal: Normal range of motion.  Lymphadenopathy:    She has no cervical adenopathy.  Neurological: She is alert and oriented to person, place, and time.  Skin: Skin is warm and dry.  Psychiatric: She has a normal mood and affect.  Nursing note and vitals reviewed.    ED Treatments / Results  Labs (all labs ordered are listed, but only abnormal results are displayed) Labs Reviewed  URINALYSIS, ROUTINE W REFLEX MICROSCOPIC - Abnormal; Notable for the following:       Result Value   Ketones, ur 15 (*)    All other components within normal limits  CBC WITH DIFFERENTIAL/PLATELET  COMPREHENSIVE METABOLIC PANEL  LIPASE, BLOOD    EKG  EKG Interpretation None       Radiology Ct Abdomen Pelvis W Contrast  Result Date: 04/06/2017 CLINICAL DATA:  .Right lower quadrant abdominal pain for 2 months. EXAM: CT ABDOMEN AND PELVIS WITH CONTRAST TECHNIQUE: Multidetector CT imaging of the abdomen and pelvis was performed using the standard protocol following bolus administration of intravenous contrast. CONTRAST:  ISOVUE-300 IOPAMIDOL (ISOVUE-300) INJECTION 61% COMPARISON:  02/28/2006 FINDINGS: Lower chest: The lung bases are clear of acute process. No pleural effusion or pulmonary lesions. The heart is normal in size. No pericardial effusion. The distal esophagus and aorta are unremarkable. Hepatobiliary: Small hepatic cysts are noted. No worrisome hepatic lesions. The gallbladder is surgically absent.  No common bile duct dilatation. Pancreas: No mass, inflammation or ductal dilatation. Spleen: Normal size.  No focal lesions. Adrenals/Urinary Tract: The adrenal glands and kidneys are unremarkable and stable. The bladder appears normal. Stomach/Bowel: The stomach, duodenum, small bowel and colon are unremarkable. No acute inflammatory process, mass lesions or obstructive findings. The terminal ileum is normal. The appendix is normal. Scattered sigmoid diverticulosis without findings for acute diverticulitis. Vascular/Lymphatic: Age advanced atherosclerotic calcifications involving the distal aorta and iliac arteries. No aneurysm or dissection. The branch vessels are patent. The major venous structures are patent. No mesenteric or retroperitoneal mass or adenopathy. Reproductive: The uterus is surgically absent. Both ovaries are still present. The left ovary has 2 small cysts associated with it. Other: No pelvic mass or adenopathy. No free pelvic fluid collections. No inguinal mass  or adenopathy. No abdominal wall hernia or subcutaneous lesions. Musculoskeletal: No significant bony findings. IMPRESSION: 1. No acute abdominal/pelvic findings, mass lesions or adenopathy. 2. Age advanced atherosclerotic calcifications involving the distal aorta and iliac arteries but no aneurysm. 3. Small hepatic cysts. 4. Status post cholecystectomy.  No biliary dilatation. Electronically Signed   By: Rudie Meyer M.D.   On: 04/06/2017 20:13    Procedures Procedures (including critical care time)  Medications Ordered in ED Medications  sodium chloride 0.9 % bolus 1,000 mL (0 mLs Intravenous Stopped 04/06/17 1938)  morphine 2 MG/ML injection 2 mg (2 mg Intravenous Given 04/06/17 1737)  ondansetron (ZOFRAN) injection 4 mg (4 mg Intravenous Given 04/06/17 1736)  iopamidol (ISOVUE-300) 61 % injection 100 mL (100 mLs Intravenous Contrast Given 04/06/17 1958)     Initial Impression / Assessment and Plan / ED Course  I have  reviewed the triage vital signs and the nursing notes.  Pertinent labs & imaging results that were available during my care of the patient were reviewed by me and considered in my medical decision making (see chart for details).     Pt presenting with intermittent RLQ pain x 2-3 months. Physical exam shows minimal TTP of RLQ. No fevers. Concern for possible SBO, diverticulitis, or possible intra-abd infection.  Will start IVF, give zofran and morphine for pain. Will order basic abd labs, UA, and CT abd.   Pain improved with medication. Labs reassuring.  CT negative for infection, obstruction, perforation or acute abd pathology. Pt had episode of diarrhea in the ED, with green stools. Discussed case with attending, and Dr. Ranae Palms agrees to plan. Discussed findings with pt. Discussed that source of pain is unknown, but she does not need surgery or hosptailization at this time. tp to fu with GI. Pt appears safe for discharge. Return precautions given. Pt states she understands and agrees to plan.   Final Clinical Impressions(s) / ED Diagnoses   Final diagnoses:  Right lower quadrant abdominal pain    New Prescriptions Discharge Medication List as of 04/06/2017  8:45 PM    START taking these medications   Details  dicyclomine (BENTYL) 20 MG tablet Take 1 tablet (20 mg total) by mouth 2 (two) times daily., Starting Wed 04/06/2017, Print    ondansetron (ZOFRAN) 4 MG tablet Take 1 tablet (4 mg total) by mouth every 6 (six) hours., Starting Wed 04/06/2017, Print         [place], [place], PA-C 04/07/17 4081    Loren Racer, MD 04/08/17 1538

## 2017-05-06 ENCOUNTER — Ambulatory Visit: Payer: Self-pay | Admitting: Family Medicine

## 2017-05-06 ENCOUNTER — Telehealth: Payer: Self-pay | Admitting: Family Medicine

## 2017-05-06 DIAGNOSIS — Z0289 Encounter for other administrative examinations: Secondary | ICD-10-CM

## 2017-05-06 NOTE — Telephone Encounter (Signed)
Pt called in to be advised she said that she is having blood in her stool.   I forwarded call to team health for triaging.

## 2017-09-12 ENCOUNTER — Ambulatory Visit: Payer: BLUE CROSS/BLUE SHIELD | Admitting: Neurology

## 2017-09-12 ENCOUNTER — Encounter: Payer: Self-pay | Admitting: Neurology

## 2017-09-12 ENCOUNTER — Ambulatory Visit (INDEPENDENT_AMBULATORY_CARE_PROVIDER_SITE_OTHER): Payer: BLUE CROSS/BLUE SHIELD | Admitting: Neurology

## 2017-09-12 DIAGNOSIS — G5603 Carpal tunnel syndrome, bilateral upper limbs: Secondary | ICD-10-CM | POA: Insufficient documentation

## 2017-09-12 NOTE — Progress Notes (Signed)
Please refer to EMG and nerve conduction study procedure note. 

## 2017-09-12 NOTE — Procedures (Signed)
     HISTORY:  Makayla Reid is a 47 year old patient with a 15-year history of numbness in the hands.  The pain has gotten much more significant in the last several months.  She has some achy sensations to the elbows and shoulders and both arms.  She denies any overt neck pain.  She is being evaluated for possible neuropathy or a cervical radiculopathy.  The left arm is more significantly involved in the right.  NERVE CONDUCTION STUDY:  Nerve conduction studies were performed on both upper extremities.  The distal motor latencies for the median nerves were prolonged bilaterally with normal motor amplitudes for these nerves.  The distal motor latencies and motor amplitudes for the ulnar nerves were normal bilaterally.  The nerve conduction velocities for the median and ulnar nerves were normal bilaterally.  The sensory latencies for the median nerves were prolonged bilaterally, normal for the ulnar nerves bilaterally.  The F-wave latencies for the median and ulnar nerves were within normal limits bilaterally.  EMG STUDIES:  EMG study was performed on the left upper extremity:  The first dorsal interosseous muscle reveals 2 to 4 K units with full recruitment. No fibrillations or positive waves were noted. The abductor pollicis brevis muscle reveals 2 to 4 K units with full recruitment. No fibrillations or positive waves were noted. The extensor indicis proprius muscle reveals 1 to 3 K units with full recruitment. No fibrillations or positive waves were noted. The pronator teres muscle reveals 2 to 3 K units with full recruitment. No fibrillations or positive waves were noted. The biceps muscle reveals 1 to 2 K units with full recruitment. No fibrillations or positive waves were noted. The triceps muscle reveals 2 to 4 K units with full recruitment. No fibrillations or positive waves were noted. The anterior deltoid muscle reveals 2 to 3 K units with full recruitment. No fibrillations or positive  waves were noted. The cervical paraspinal muscles were tested at 2 levels. No abnormalities of insertional activity were seen at either level tested. There was good relaxation.   IMPRESSION:  Nerve conduction studies done on both upper extremities show evidence of mild bilateral carpal tunnel syndrome.  EMG evaluation of the left upper extremity was unremarkable without evidence of an overlying cervical radiculopathy.  Marlan Palau. Keith Jay Kempe MD 09/12/2017 4:16 PM  [facility] [address] [place], [place] [id number]  Phone [phone] Fax [phone]

## 2017-09-12 NOTE — Progress Notes (Signed)
[facility]    Nerve / Sites Muscle Latency Ref. Amplitude Ref. Rel Amp Segments Distance Velocity Ref. Area    ms ms mV mV %  cm m/s m/s mVms  L Median - APB     Wrist APB 4.3 ?4.4 11.9 ?4.0 100 Wrist - APB 7   35.7     Upper arm APB 8.0  11.0  92.2 Upper arm - Wrist 23 61 ?49 35.6  R Median - APB     Wrist APB 4.3 ?4.4 15.3 ?4.0 100 Wrist - APB 7   46.5     Upper arm APB 7.9  14.0  91.4 Upper arm - Wrist 23 65 ?49 44.1  L Ulnar - ADM     Wrist ADM 2.5 ?3.3 12.1 ?6.0 100 Wrist - ADM 7   44.2     B.Elbow ADM 6.1  11.4  94.5 B.Elbow - Wrist 21 58 ?49 42.8     A.Elbow ADM 7.8  11.2  98.4 A.Elbow - B.Elbow 10 60 ?49 42.2         A.Elbow - Wrist      R Ulnar - ADM     Wrist ADM 2.3 ?3.3 10.0 ?6.0 100 Wrist - ADM 7   35.8     B.Elbow ADM 6.2  7.6  75.8 B.Elbow - Wrist 21 54 ?49 27.7     A.Elbow ADM 7.8  7.5  98.3 A.Elbow - B.Elbow 10 64 ?49 24.3         A.Elbow - Wrist                 SNC    Nerve / Sites Rec. Site Peak Lat Amp Segments Distance    ms V  cm  L Median - Orthodromic (Dig II, Mid palm)     Dig II Wrist 4.5 23 Dig II - Wrist 13  R Median - Orthodromic (Dig II, Mid palm)     Dig II Wrist 4.3 24 Dig II - Wrist 13  L Ulnar - Orthodromic, (Dig V, Mid palm)     Dig V Wrist 3.1 31 Dig V - Wrist 11  R Ulnar - Orthodromic, (Dig V, Mid palm)     Dig V Wrist 2.9 6 Dig V - Wrist 1211             F  Wave    Nerve F Lat Ref.   ms ms  L Median - APB 26.9 ?31.0  L Ulnar - ADM 28.3 ?32.0  R Median - APB 26.8 ?31.0  R Ulnar - ADM 28.1 ?32.0

## 2017-10-04 ENCOUNTER — Encounter (HOSPITAL_BASED_OUTPATIENT_CLINIC_OR_DEPARTMENT_OTHER): Payer: Self-pay

## 2017-10-04 ENCOUNTER — Other Ambulatory Visit: Payer: Self-pay

## 2017-10-04 ENCOUNTER — Other Ambulatory Visit: Payer: Self-pay | Admitting: Orthopedic Surgery

## 2017-10-04 NOTE — Progress Notes (Signed)
Spoke with:  Analys NPO:  After Midnight, no gum, candy, or mints   Arrival time:  0800AM Labs:  Hemoglobin AM medications:  Xanax, Zyrtec Pre op orders: No needs second sign Ride home:  Trey PaulaJeff (husband) [phone]

## 2017-10-06 NOTE — H&P (Addendum)
PREOPERATIVE H&P  Chief Complaint: right carpal tunnel syndrome  HPI: Makayla Reid is a 47 y.o. female who presents for evaluation of right carpal tunnel syndrome. It has been present for many years and has been worsening. She has failed conservative measures. Pain is rated as moderate.  Past Medical History:  Diagnosis Date  . Anxiety   . Bilateral carpal tunnel syndrome 09/12/2017  . Complication of anesthesia    Severe migraine headache  . COPD (chronic obstructive pulmonary disease) (HCC)   . Depression   . GERD (gastroesophageal reflux disease)    History of  . History of hiatal hernia   . Kidney infection   . Reinke's edema of vocal folds   . Seasonal allergies   . Tobacco abuse disorder 07/13/2016  . Upper respiratory infection 07/13/2016   Past Surgical History:  Procedure Laterality Date  . ABDOMINAL HYSTERECTOMY    . CHOLECYSTECTOMY    . DIAGNOSTIC LAPAROSCOPY  09/16/1999   Dr. Trudie ReedJoseph Narins, biopsy of left uterosacral ligament and left uterosacral nerve ablation  . MICROLARYNGOSCOPY    . TONSILLECTOMY    . TUBAL LIGATION     Social History   Socioeconomic History  . Marital status: Married    Spouse name: None  . Number of children: None  . Years of education: None  . Highest education level: None  Social Needs  . Financial resource strain: None  . Food insecurity - worry: None  . Food insecurity - inability: None  . Transportation needs - medical: None  . Transportation needs - non-medical: None  Occupational History  . None  Tobacco Use  . Smoking status: Current Every Day Smoker    Packs/day: 0.25    Years: 33.00    Pack years: 8.25    Types: Cigarettes  . Smokeless tobacco: Never Used  Substance and Sexual Activity  . Alcohol use: Yes    Comment: socially  . Drug use: No  . Sexual activity: None  Other Topics Concern  . None  Social History Narrative  . None   Family History  Problem Relation Age of Onset  . Allergic rhinitis  Mother   . Kidney disease Mother   . Allergic rhinitis Father   . Allergic rhinitis Brother   . Kidney disease Maternal Uncle   . Angioedema Neg Hx   . Asthma Neg Hx   . Atopy Neg Hx   . Eczema Neg Hx   . Immunodeficiency Neg Hx   . Urticaria Neg Hx    Allergies  Allergen Reactions  . Milk-Related Compounds Nausea And Vomiting  . Penicillins Rash   Prior to Admission medications   Medication Sig Start Date End Date Taking? Authorizing Provider  HYDROcodone-acetaminophen (NORCO/VICODIN) 5-325 MG tablet Take 1-2 tablets by mouth every 4 (four) hours as needed for moderate pain.   Yes [provider]  ALPRAZolam Prudy Feeler(XANAX) 1 MG tablet Take 1 tablet (1 mg total) by mouth 3 (three) times daily as needed for anxiety or sleep. Office visit needed 07/19/16   Copland, Gwenlyn FoundJessica C, MD  cetirizine (ZYRTEC) 10 MG tablet Take 10 mg by mouth daily.    [provider]  EPINEPHrine (EPIPEN 2-PAK) 0.3 mg/0.3 mL IJ SOAJ injection Inject 0.3 mLs (0.3 mg total) into the muscle once. 11/11/16 11/11/16  Bobbitt, Heywood Ilesalph Carter, MD  Multiple Vitamin (THERA) TABS Take by mouth.    [provider]  Omega-3 1000 MG CAPS Take 1 g by mouth.    [provider]  Positive ROS: none  All other systems have been reviewed and were otherwise negative with the exception of those mentioned in the HPI and as above.  Physical Exam: There were no vitals filed for this visit. Vitals:   10/07/17 0758  BP: 118/66  Pulse: 64  Resp: 16  Temp: 97.6 F (36.4 C)  SpO2: 98%    General: Alert, no acute distress Cardiovascular: No pedal edema Respiratory: No cyanosis, no use of accessory musculature GI: No organomegaly, abdomen is soft and non-tender Skin: No lesions in the area of chief complaint Neurologic: Sensation intact distally Psychiatric: Patient is competent for consent with normal mood and affect Lymphatic: No axillary or cervical lymphadenopathy  MUSCULOSKELETAL: right  wrist:  Positive Phalen's, positive Tinel's, positive pain to range of motion  Assessment/Plan: RIGHT CARPAL TUNNEL SYNDROME Plan for Procedure(s): CARPAL TUNNEL RELEASE  The risks benefits and alternatives were discussed with the patient including but not limited to the risks of nonoperative treatment, versus surgical intervention including infection, bleeding, nerve injury, malunion, nonunion, hardware prominence, hardware failure, need for hardware removal, blood clots, cardiopulmonary complications, morbidity, mortality, among others, and they were willing to proceed.  Predicted outcome is good, although there will be at least a six to nine month expected recovery.  No change in H@P  overnight.  Harvie Junior, MD 10/06/2017 8:03 PM

## 2017-10-07 ENCOUNTER — Ambulatory Visit (HOSPITAL_BASED_OUTPATIENT_CLINIC_OR_DEPARTMENT_OTHER)
Admission: RE | Admit: 2017-10-07 | Discharge: 2017-10-07 | Disposition: A | Discharge status: Home / Self Care | Payer: BLUE CROSS/BLUE SHIELD | Source: Ambulatory Visit | Attending: Orthopedic Surgery | Admitting: Orthopedic Surgery

## 2017-10-07 ENCOUNTER — Encounter (HOSPITAL_BASED_OUTPATIENT_CLINIC_OR_DEPARTMENT_OTHER): Payer: Self-pay | Admitting: *Deleted

## 2017-10-07 ENCOUNTER — Ambulatory Visit (HOSPITAL_BASED_OUTPATIENT_CLINIC_OR_DEPARTMENT_OTHER): Payer: BLUE CROSS/BLUE SHIELD | Admitting: Certified Registered"

## 2017-10-07 ENCOUNTER — Other Ambulatory Visit: Payer: Self-pay

## 2017-10-07 ENCOUNTER — Encounter (HOSPITAL_BASED_OUTPATIENT_CLINIC_OR_DEPARTMENT_OTHER)
Admission: RE | Disposition: A | Discharge status: Home / Self Care | Payer: Self-pay | Source: Ambulatory Visit | Attending: Orthopedic Surgery

## 2017-10-07 DIAGNOSIS — J449 Chronic obstructive pulmonary disease, unspecified: Secondary | ICD-10-CM | POA: Diagnosis not present

## 2017-10-07 DIAGNOSIS — F419 Anxiety disorder, unspecified: Secondary | ICD-10-CM | POA: Insufficient documentation

## 2017-10-07 DIAGNOSIS — K219 Gastro-esophageal reflux disease without esophagitis: Secondary | ICD-10-CM | POA: Diagnosis not present

## 2017-10-07 DIAGNOSIS — Z91011 Allergy to milk products: Secondary | ICD-10-CM | POA: Insufficient documentation

## 2017-10-07 DIAGNOSIS — Z880 Allergy status to penicillin: Secondary | ICD-10-CM | POA: Insufficient documentation

## 2017-10-07 DIAGNOSIS — F329 Major depressive disorder, single episode, unspecified: Secondary | ICD-10-CM | POA: Insufficient documentation

## 2017-10-07 DIAGNOSIS — M65831 Other synovitis and tenosynovitis, right forearm: Secondary | ICD-10-CM | POA: Insufficient documentation

## 2017-10-07 DIAGNOSIS — F17210 Nicotine dependence, cigarettes, uncomplicated: Secondary | ICD-10-CM | POA: Insufficient documentation

## 2017-10-07 DIAGNOSIS — Z79899 Other long term (current) drug therapy: Secondary | ICD-10-CM | POA: Diagnosis not present

## 2017-10-07 DIAGNOSIS — G5601 Carpal tunnel syndrome, right upper limb: Secondary | ICD-10-CM | POA: Diagnosis present

## 2017-10-07 HISTORY — DX: Other complications of anesthesia, initial encounter: T88.59XA

## 2017-10-07 HISTORY — DX: Other seasonal allergic rhinitis: J30.2

## 2017-10-07 HISTORY — DX: Gastro-esophageal reflux disease without esophagitis: K21.9

## 2017-10-07 HISTORY — DX: Depression, unspecified: F32.A

## 2017-10-07 HISTORY — PX: CARPAL TUNNEL RELEASE: SHX101

## 2017-10-07 HISTORY — DX: Major depressive disorder, single episode, unspecified: F32.9

## 2017-10-07 HISTORY — DX: Personal history of other diseases of the digestive system: Z87.19

## 2017-10-07 HISTORY — DX: Adverse effect of unspecified anesthetic, initial encounter: T41.45XA

## 2017-10-07 HISTORY — DX: Chronic obstructive pulmonary disease, unspecified: J44.9

## 2017-10-07 HISTORY — DX: Anxiety disorder, unspecified: F41.9

## 2017-10-07 LAB — POCT HEMOGLOBIN-HEMACUE: Hemoglobin: 14.6 g/dL (ref 12.0–15.0)

## 2017-10-07 SURGERY — CARPAL TUNNEL RELEASE
Anesthesia: General | Site: Hand | Laterality: Right

## 2017-10-07 MED ORDER — OXYCODONE-ACETAMINOPHEN 5-325 MG PO TABS: 1.0000 | ORAL_TABLET | Freq: Four times a day (QID) | ORAL | 0 refills | Status: DC | PRN

## 2017-10-07 MED ORDER — CLINDAMYCIN PHOSPHATE 900 MG/50ML IV SOLN
900.0000 mg | INTRAVENOUS | Status: AC
  Administered 2017-10-07: 900 mg via INTRAVENOUS
  Filled 2017-10-07: qty 50

## 2017-10-07 MED ORDER — OXYCODONE HCL 5 MG PO TABS
5.0000 mg | ORAL_TABLET | Freq: Once | ORAL | Status: DC | PRN
  Filled 2017-10-07: qty 1

## 2017-10-07 MED ORDER — BUPIVACAINE HCL (PF) 0.5 % IJ SOLN
INTRAMUSCULAR | Status: DC | PRN
  Administered 2017-10-07: 5 mL

## 2017-10-07 MED ORDER — ONDANSETRON HCL 4 MG/2ML IJ SOLN
INTRAMUSCULAR | Status: AC
  Filled 2017-10-07: qty 2

## 2017-10-07 MED ORDER — DEXAMETHASONE SODIUM PHOSPHATE 10 MG/ML IJ SOLN
INTRAMUSCULAR | Status: AC
  Filled 2017-10-07: qty 1

## 2017-10-07 MED ORDER — LIDOCAINE 2% (20 MG/ML) 5 ML SYRINGE
INTRAMUSCULAR | Status: DC | PRN
  Administered 2017-10-07: 50 mg via INTRAVENOUS

## 2017-10-07 MED ORDER — CLINDAMYCIN PHOSPHATE 900 MG/50ML IV SOLN
INTRAVENOUS | Status: AC
  Filled 2017-10-07: qty 50

## 2017-10-07 MED ORDER — EPHEDRINE SULFATE-NACL 50-0.9 MG/10ML-% IV SOSY
PREFILLED_SYRINGE | INTRAVENOUS | Status: DC | PRN
  Administered 2017-10-07 (×3): 5 mg via INTRAVENOUS

## 2017-10-07 MED ORDER — POVIDONE-IODINE 10 % EX SWAB
2.0000 "application " | Freq: Once | CUTANEOUS | Status: DC
  Filled 2017-10-07: qty 2

## 2017-10-07 MED ORDER — FENTANYL CITRATE (PF) 100 MCG/2ML IJ SOLN
INTRAMUSCULAR | Status: AC
  Filled 2017-10-07: qty 2

## 2017-10-07 MED ORDER — HYDROMORPHONE HCL 1 MG/ML IJ SOLN
0.2500 mg | INTRAMUSCULAR | Status: DC | PRN
  Filled 2017-10-07: qty 0.5

## 2017-10-07 MED ORDER — DEXAMETHASONE SODIUM PHOSPHATE 10 MG/ML IJ SOLN
INTRAMUSCULAR | Status: DC | PRN
  Administered 2017-10-07: 10 mg via INTRAVENOUS

## 2017-10-07 MED ORDER — ONDANSETRON HCL 4 MG/2ML IJ SOLN
INTRAMUSCULAR | Status: DC | PRN
  Administered 2017-10-07: 4 mg via INTRAVENOUS

## 2017-10-07 MED ORDER — PROMETHAZINE HCL 25 MG/ML IJ SOLN
6.2500 mg | INTRAMUSCULAR | Status: DC | PRN
  Filled 2017-10-07: qty 1

## 2017-10-07 MED ORDER — CHLORHEXIDINE GLUCONATE 4 % EX LIQD
60.0000 mL | Freq: Once | CUTANEOUS | Status: DC
  Filled 2017-10-07: qty 118

## 2017-10-07 MED ORDER — OXYCODONE HCL 5 MG/5ML PO SOLN
5.0000 mg | Freq: Once | ORAL | Status: DC | PRN
  Filled 2017-10-07: qty 5

## 2017-10-07 MED ORDER — MIDAZOLAM HCL 2 MG/2ML IJ SOLN
INTRAMUSCULAR | Status: DC | PRN
  Administered 2017-10-07: 2 mg via INTRAVENOUS

## 2017-10-07 MED ORDER — FENTANYL CITRATE (PF) 100 MCG/2ML IJ SOLN
INTRAMUSCULAR | Status: DC | PRN
  Administered 2017-10-07: 50 ug via INTRAVENOUS

## 2017-10-07 MED ORDER — PROPOFOL 10 MG/ML IV BOLUS
INTRAVENOUS | Status: AC
  Filled 2017-10-07: qty 20

## 2017-10-07 MED ORDER — PROPOFOL 10 MG/ML IV BOLUS
INTRAVENOUS | Status: DC | PRN
  Administered 2017-10-07: 150 mg via INTRAVENOUS

## 2017-10-07 MED ORDER — LACTATED RINGERS IV SOLN
INTRAVENOUS | Status: DC
  Administered 2017-10-07: 09:00:00 via INTRAVENOUS
  Filled 2017-10-07: qty 1000

## 2017-10-07 MED ORDER — LIDOCAINE 2% (20 MG/ML) 5 ML SYRINGE
INTRAMUSCULAR | Status: AC
Start: 2017-10-07 — End: ?
  Filled 2017-10-07: qty 5

## 2017-10-07 MED ORDER — MIDAZOLAM HCL 2 MG/2ML IJ SOLN
INTRAMUSCULAR | Status: AC
Start: 2017-10-07 — End: ?
  Filled 2017-10-07: qty 2

## 2017-10-07 MED ORDER — MEPERIDINE HCL 25 MG/ML IJ SOLN
6.2500 mg | INTRAMUSCULAR | Status: DC | PRN
  Filled 2017-10-07: qty 1

## 2017-10-07 SURGICAL SUPPLY — 48 items
BANDAGE ACE 3X5.8 VEL STRL LF (GAUZE/BANDAGES/DRESSINGS) ×3 IMPLANT
BANDAGE ELASTIC 3 VELCRO ST LF (GAUZE/BANDAGES/DRESSINGS) ×2 IMPLANT
BLADE SURG 15 STRL LF DISP TIS (BLADE) ×1 IMPLANT
BLADE SURG 15 STRL SS (BLADE) ×3
BNDG CMPR 9X4 STRL LF SNTH (GAUZE/BANDAGES/DRESSINGS) ×1
BNDG CONFORM 3 STRL LF (GAUZE/BANDAGES/DRESSINGS) ×3 IMPLANT
BNDG ESMARK 4X9 LF (GAUZE/BANDAGES/DRESSINGS) ×2 IMPLANT
COVER BACK TABLE 60X90IN (DRAPES) ×3 IMPLANT
COVER MAYO STAND STRL (DRAPES) ×3 IMPLANT
CUFF TOURNIQUET SINGLE 18IN (TOURNIQUET CUFF) ×2 IMPLANT
DECANTER SPIKE VIAL GLASS SM (MISCELLANEOUS) IMPLANT
DRAPE EXTREMITY T 121X128X90 (DRAPE) ×3 IMPLANT
DRAPE SURG 17X23 STRL (DRAPES) ×3 IMPLANT
DRSG EMULSION OIL 3X3 NADH (GAUZE/BANDAGES/DRESSINGS) ×2 IMPLANT
DURAPREP 26ML APPLICATOR (WOUND CARE) ×3 IMPLANT
ELECT NDL TIP 2.8 STRL (NEEDLE) IMPLANT
ELECT NEEDLE TIP 2.8 STRL (NEEDLE) IMPLANT
ELECT REM PT RETURN 9FT ADLT (ELECTROSURGICAL) ×3
ELECTRODE REM PT RTRN 9FT ADLT (ELECTROSURGICAL) IMPLANT
GAUZE SPONGE 4X4 12PLY STRL (GAUZE/BANDAGES/DRESSINGS) ×3 IMPLANT
GAUZE SPONGE 4X4 12PLY STRL LF (GAUZE/BANDAGES/DRESSINGS) ×3 IMPLANT
GLOVE BIOGEL PI IND STRL 8 (GLOVE) ×2 IMPLANT
GLOVE BIOGEL PI INDICATOR 8 (GLOVE) ×4
GLOVE ECLIPSE 7.5 STRL STRAW (GLOVE) ×6 IMPLANT
GOWN STRL REUS W/ TWL LRG LVL3 (GOWN DISPOSABLE) ×1 IMPLANT
GOWN STRL REUS W/ TWL XL LVL3 (GOWN DISPOSABLE) ×1 IMPLANT
GOWN STRL REUS W/TWL LRG LVL3 (GOWN DISPOSABLE) ×3
GOWN STRL REUS W/TWL XL LVL3 (GOWN DISPOSABLE) ×6 IMPLANT
NDL HYPO 25X1 1.5 SAFETY (NEEDLE) IMPLANT
NEEDLE HYPO 25X1 1.5 SAFETY (NEEDLE) ×3 IMPLANT
NS IRRIG 1000ML POUR BTL (IV SOLUTION) ×3 IMPLANT
PACK BASIN DAY SURGERY FS (CUSTOM PROCEDURE TRAY) ×3 IMPLANT
PAD CAST 3X4 CTTN HI CHSV (CAST SUPPLIES) ×1 IMPLANT
PAD CAST 4YDX4 CTTN HI CHSV (CAST SUPPLIES) IMPLANT
PADDING CAST ABS 4INX4YD NS (CAST SUPPLIES)
PADDING CAST ABS COTTON 4X4 ST (CAST SUPPLIES) IMPLANT
PADDING CAST COTTON 3X4 STRL (CAST SUPPLIES) ×3
PADDING CAST COTTON 4X4 STRL (CAST SUPPLIES) ×3
PENCIL BUTTON HOLSTER BLD 10FT (ELECTRODE) ×2 IMPLANT
SPLINT PLASTER CAST XFAST 3X15 (CAST SUPPLIES) ×5 IMPLANT
SPLINT PLASTER XTRA FASTSET 3X (CAST SUPPLIES) ×2
STOCKINETTE 4X48 STRL (DRAPES) ×3 IMPLANT
SUT ETHILON 4 0 PS 2 18 (SUTURE) ×3 IMPLANT
SYR BULB 3OZ (MISCELLANEOUS) ×3 IMPLANT
SYR CONTROL 10ML LL (SYRINGE) ×2 IMPLANT
TOWEL OR 17X24 6PK STRL BLUE (TOWEL DISPOSABLE) ×3 IMPLANT
TOWEL OR NON WOVEN STRL DISP B (DISPOSABLE) ×3 IMPLANT
UNDERPAD 30X30 (UNDERPADS AND DIAPERS) ×3 IMPLANT

## 2017-10-07 NOTE — Discharge Instructions (Signed)
Ice and elevate your right hand. Move your fingers as tolerated. Keep dressing in place and dry.   Post Anesthesia Home Care Instructions  Activity: Get plenty of rest for the remainder of the day. A responsible individual must stay with you for 24 hours following the procedure.  For the next 24 hours, DO NOT: -Drive a car -Advertising copywriterperate machinery -Drink alcoholic beverages -Take any medication unless instructed by your physician -Make any legal decisions or sign important papers.  Meals: Start with liquid foods such as gelatin or soup. Progress to regular foods as tolerated. Avoid greasy, spicy, heavy foods. If nausea and/or vomiting occur, drink only clear liquids until the nausea and/or vomiting subsides. Call your physician if vomiting continues.  Special Instructions/Symptoms: Your throat may feel dry or sore from the anesthesia or the breathing tube placed in your throat during surgery. If this causes discomfort, gargle with warm salt water. The discomfort should disappear within 24 hours.  If you had a scopolamine patch placed behind your ear for the management of post- operative nausea and/or vomiting:  1. The medication in the patch is effective for 72 hours, after which it should be removed.  Wrap patch in a tissue and discard in the trash. Wash hands thoroughly with soap and water. 2. You may remove the patch earlier than 72 hours if you experience unpleasant side effects which may include dry mouth, dizziness or visual disturbances. 3. Avoid touching the patch. Wash your hands with soap and water after contact with the patch.

## 2017-10-07 NOTE — Anesthesia Postprocedure Evaluation (Signed)
Anesthesia Post Note  Patient: Paticia Stackerri L Ransford  Procedure(s) Performed: CARPAL TUNNEL RELEASE (Right Hand)     Patient location during evaluation: PACU Anesthesia Type: General Level of consciousness: awake and alert Pain management: pain level controlled Vital Signs Assessment: post-procedure vital signs reviewed and stable Respiratory status: spontaneous breathing, nonlabored ventilation and respiratory function stable Cardiovascular status: blood pressure returned to baseline and stable Postop Assessment: no apparent nausea or vomiting Anesthetic complications: no    Last Vitals:  Vitals:   10/07/17 1115 10/07/17 1144  BP: 117/76 120/72  Pulse: (!) 57 61  Resp: 17 15  Temp:  (!) 36.3 C  SpO2: 90% 98%    Last Pain:  Vitals:   10/07/17 0758  TempSrc: Oral                 Lowella CurbWarren Ray Carmilla Granville

## 2017-10-07 NOTE — Brief Op Note (Signed)
10/07/2017  12:22 PM  PATIENT:  Makayla Reid  47 y.o. female  PRE-OPERATIVE DIAGNOSIS:  RIGHT CARPAL TUNNEL SYNDROME  POST-OPERATIVE DIAGNOSIS:  RIGHT CARPAL TUNNEL SYNDROME  PROCEDURE:  Procedure(s): CARPAL TUNNEL RELEASE (Right)  SURGEON:  Surgeon(s) and Role:    Jodi Geralds* Lillith Mcneff, MD - Primary  PHYSICIAN ASSISTANT:   ASSISTANTS: bethune   ANESTHESIA:   general  EBL:  1 mL   BLOOD ADMINISTERED:none  DRAINS: none   LOCAL MEDICATIONS USED:  MARCAINE     SPECIMEN:  No Specimen  DISPOSITION OF SPECIMEN:  N/A  COUNTS:  YES  TOURNIQUET:   Total Tourniquet Time Documented: Forearm (Right) - -[id number] minutes Total: Forearm (Right) - -[id number] minutes   DICTATION: .Other Dictation: Dictation Number [id number]  PLAN OF CARE: Discharge to home after PACU  PATIENT DISPOSITION:  PACU - hemodynamically stable.   Delay start of Pharmacological VTE agent (>24hrs) due to surgical blood loss or risk of bleeding: no

## 2017-10-07 NOTE — Transfer of Care (Signed)
Immediate Anesthesia Transfer of Care Note  Patient: Makayla Reid  Procedure(s) Performed: Procedure(s) (LRB): CARPAL TUNNEL RELEASE (Right)  Patient Location: PACU  Anesthesia Type: General  Level of Consciousness: awake, oriented, sedated and patient cooperative  Airway & Oxygen Therapy: Patient Spontanous Breathing and Patient connected to face mask oxygen  Post-op Assessment: Report given to PACU RN and Post -op Vital signs reviewed and stable  Post vital signs: Reviewed and stable  Complications: No apparent anesthesia complications Last Vitals:  Vitals:   10/07/17 0758 10/07/17 1026  BP: 118/66 (P) 98/67  Pulse: 64 (P) 66  Resp: 16 (P) 14  Temp: 36.4 C (P) 36.5 C  SpO2: 98% (P) 100%    Last Pain:  Vitals:   10/07/17 0758  TempSrc: Oral      Patients Stated Pain Goal: 9 (10/07/17 0814)

## 2017-10-07 NOTE — Anesthesia Preprocedure Evaluation (Signed)
Anesthesia Evaluation  Patient identified by MRN, date of birth, ID band Patient awake    Reviewed: Allergy & Precautions, NPO status , Patient's Chart, lab work & pertinent test results  Airway Mallampati: II  TM Distance: >3 FB Neck ROM: Full    Dental no notable dental hx.    Pulmonary neg pulmonary ROS, COPD, Current Smoker,    Pulmonary exam normal breath sounds clear to auscultation       Cardiovascular negative cardio ROS Normal cardiovascular exam Rhythm:Regular Rate:Normal     Neuro/Psych  Headaches, Anxiety Depression negative neurological ROS  negative psych ROS   GI/Hepatic negative GI ROS, Neg liver ROS, GERD  ,  Endo/Other  negative endocrine ROS  Renal/GU negative Renal ROS  negative genitourinary   Musculoskeletal negative musculoskeletal ROS (+)   Abdominal   Peds negative pediatric ROS (+)  Hematology negative hematology ROS (+)   Anesthesia Other Findings   Reproductive/Obstetrics negative OB ROS                             Anesthesia Physical Anesthesia Plan  ASA: II  Anesthesia Plan: General   Post-op Pain Management:    Induction: Intravenous  PONV Risk Score and Plan: 2 and Ondansetron and Midazolam  Airway Management Planned: LMA  Additional Equipment:   Intra-op Plan:   Post-operative Plan: Extubation in OR  Informed Consent: I have reviewed the patients History and Physical, chart, labs and discussed the procedure including the risks, benefits and alternatives for the proposed anesthesia with the patient or authorized representative who has indicated his/her understanding and acceptance.   Dental advisory given  Plan Discussed with: CRNA  Anesthesia Plan Comments:         Anesthesia Quick Evaluation

## 2017-10-07 NOTE — Anesthesia Procedure Notes (Signed)
Procedure Name: LMA Insertion Date/Time: 10/07/2017 9:51 AM Performed by: Francie MassingHazel, Kenyada Dosch D, CRNA Pre-anesthesia Checklist: Patient identified, Emergency Drugs available, Suction available and Patient being monitored Patient Re-evaluated:Patient Re-evaluated prior to induction Oxygen Delivery Method: Circle system utilized Preoxygenation: Pre-oxygenation with 100% oxygen Induction Type: IV induction Ventilation: Mask ventilation without difficulty LMA: LMA inserted LMA Size: 4.0 Number of attempts: 1 Airway Equipment and Method: Bite block Placement Confirmation: positive ETCO2 Tube secured with: Tape Dental Injury: Teeth and Oropharynx as per pre-operative assessment

## 2017-10-10 ENCOUNTER — Encounter (HOSPITAL_BASED_OUTPATIENT_CLINIC_OR_DEPARTMENT_OTHER): Payer: Self-pay | Admitting: Orthopedic Surgery

## 2017-10-10 NOTE — Op Note (Signed)
NAME:  Makayla Reid, Makayla Reid                    ACCOUNT NO.:  MEDICAL RECORD NO.:  [id number]  LOCATION:                                 FACILITY:  PHYSICIAN:  Harvie JuniorJohn L. Aquarius Latouche, M.D.   DATE OF BIRTH:  20-May-1971  DATE OF PROCEDURE:  10/07/2017 DATE OF DISCHARGE:                              OPERATIVE REPORT   PREOPERATIVE DIAGNOSIS:  Carpal tunnel release, right.  POSTOPERATIVE DIAGNOSIS:  Carpal tunnel release, right.  PROCEDURE:  Carpal tunnel release, right.  SURGEON:  Harvie JuniorJohn L. Vallory Oetken, M.D.  Threasa HeadsASSISTANOrma Flaming:  Bethune.  ANESTHESIA:  General.  BRIEF HISTORY:  Makayla Reid is a [age] year old female with a long history and significant complaints of bilateral carpal tunnel release.  She had been treated conservatively for a long time.  EMGs were obtained, which showed she had bilateral carpal tunnel syndrome.  She has had the left side done 3 weeks ago and did great with that, and after failure of conservative care and continued complaints of numbness and tingling and clumsiness in the right hand, she was taken to the operating room for right carpal tunnel release.  DESCRIPTION OF PROCEDURE:  The patient was taken to the operating room, and after adequate anesthesia was obtained with a general anesthetic, the patient was placed supine on the operating table.  The right arm was prepped and draped in usual sterile fashion.  Following this, a small incision was made just in line with the 4th finger.  Subcutaneous tissue was taken down the level of the volar carpal ligament, which was clearly identified and divided in line with its fibers.  Freer elevators were put above and below to make sure the nerve was not injured and the carpal tunnel was released proximally and distally.  Gloved finger was placed in the wound and both areas were irrigated, suctioned dry.  We did at this time to take a look at the flexor tendons.  There was a tremendous amount of tenosynovitis and we did a moderate  synovectomy. At this point, the wounds were irrigated, suctioned dry and the skin was closed with a combination of interrupted and running sutures.  A sterile compressive dressing was applied as well as a volar plaster, and the patient was taken to the recovery room where she was noted to be in satisfactory condition.  Estimated blood loss for the procedure is none.    Harvie JuniorJohn L. Jaxan Michel, M.D.    Ranae PlumberJLG/MEDQ  D:  10/07/2017  T:  10/07/2017  Job:  [id number]

## 2017-11-22 ENCOUNTER — Other Ambulatory Visit: Payer: Self-pay | Admitting: Allergy and Immunology

## 2018-01-20 ENCOUNTER — Other Ambulatory Visit (HOSPITAL_BASED_OUTPATIENT_CLINIC_OR_DEPARTMENT_OTHER): Payer: Self-pay | Admitting: Nurse Practitioner

## 2018-01-20 ENCOUNTER — Ambulatory Visit (HOSPITAL_BASED_OUTPATIENT_CLINIC_OR_DEPARTMENT_OTHER)
Admission: RE | Admit: 2018-01-20 | Discharge: 2018-01-20 | Disposition: A | Discharge status: Home / Self Care | Payer: BLUE CROSS/BLUE SHIELD | Source: Ambulatory Visit | Attending: Nurse Practitioner | Admitting: Nurse Practitioner

## 2018-01-20 DIAGNOSIS — J449 Chronic obstructive pulmonary disease, unspecified: Secondary | ICD-10-CM | POA: Diagnosis not present

## 2018-01-20 DIAGNOSIS — R062 Wheezing: Secondary | ICD-10-CM

## 2018-05-25 ENCOUNTER — Institutional Professional Consult (permissible substitution): Payer: BLUE CROSS/BLUE SHIELD | Admitting: Pulmonary Disease

## 2018-05-30 ENCOUNTER — Institutional Professional Consult (permissible substitution): Payer: BLUE CROSS/BLUE SHIELD | Admitting: Pulmonary Disease

## 2018-05-30 NOTE — Progress Notes (Deleted)
Synopsis: Referred in October 2019 for left lung pain by Mickle Plumb, NP  Subjective:   PATIENT ID: Makayla Reid GENDER: female DOB: 23-Sep-1970, MRN: [id number]  No chief complaint on file.   HPI  ***  Past Medical History:  Diagnosis Date  . Anxiety   . Bilateral carpal tunnel syndrome 09/12/2017  . Complication of anesthesia    Severe migraine headache  . COPD (chronic obstructive pulmonary disease) (HCC)   . Depression   . GERD (gastroesophageal reflux disease)    History of  . History of hiatal hernia   . Kidney infection   . Reinke's edema of vocal folds   . Seasonal allergies   . Tobacco abuse disorder 07/13/2016  . Upper respiratory infection 07/13/2016     Family History  Problem Relation Age of Onset  . Allergic rhinitis Mother   . Kidney disease Mother   . Allergic rhinitis Father   . Allergic rhinitis Brother   . Kidney disease Maternal Uncle   . Angioedema Neg Hx   . Asthma Neg Hx   . Atopy Neg Hx   . Eczema Neg Hx   . Immunodeficiency Neg Hx   . Urticaria Neg Hx      Past Surgical History:  Procedure Laterality Date  . ABDOMINAL HYSTERECTOMY    . CARPAL TUNNEL RELEASE Right 10/07/2017   Procedure: CARPAL TUNNEL RELEASE;  Surgeon: Jodi Geralds, MD;  Location: [facility];  Service: Orthopedics;  Laterality: Right;  . CHOLECYSTECTOMY    . DIAGNOSTIC LAPAROSCOPY  09/16/1999   Dr. Trudie Reed, biopsy of left uterosacral ligament and left uterosacral nerve ablation  . MICROLARYNGOSCOPY    . TONSILLECTOMY    . TUBAL LIGATION      Social History   Socioeconomic History  . Marital status: Married    Spouse name: Not on file  . Number of children: Not on file  . Years of education: Not on file  . Highest education level: Not on file  Occupational History  . Not on file  Social Needs  . Financial resource strain: Not on file  . Food insecurity:    Worry: Not on file    Inability: Not on file  . Transportation needs:   Medical: Not on file    Non-medical: Not on file  Tobacco Use  . Smoking status: Current Every Day Smoker    Packs/day: 0.25    Years: 33.00    Pack years: 8.25    Types: Cigarettes  . Smokeless tobacco: Never Used  Substance and Sexual Activity  . Alcohol use: Yes    Comment: socially  . Drug use: No  . Sexual activity: Not on file  Lifestyle  . Physical activity:    Days per week: Not on file    Minutes per session: Not on file  . Stress: Not on file  Relationships  . Social connections:    Talks on phone: Not on file    Gets together: Not on file    Attends religious service: Not on file    Active member of club or organization: Not on file    Attends meetings of clubs or organizations: Not on file    Relationship status: Not on file  . Intimate partner violence:    Fear of current or ex partner: Not on file    Emotionally abused: Not on file    Physically abused: Not on file    Forced sexual activity: Not on file  Other Topics Concern  . Not on file  Social History Narrative  . Not on file     Allergies  Allergen Reactions  . Milk-Related Compounds Nausea And Vomiting  . Penicillins Rash     Outpatient Medications Prior to Visit  Medication Sig Dispense Refill  . ALPRAZolam (XANAX) 1 MG tablet Take 1 tablet (1 mg total) by mouth 3 (three) times daily as needed for anxiety or sleep. Office visit needed 90 tablet 0  . cetirizine (ZYRTEC) 10 MG tablet Take 10 mg by mouth daily.    Marland Kitchen EPINEPHrine (EPIPEN 2-PAK) 0.3 mg/0.3 mL IJ SOAJ injection Inject 0.3 mLs (0.3 mg total) into the muscle once. 2 Device 1  . Multiple Vitamin (THERA) TABS Take by mouth.    . Omega-3 1000 MG CAPS Take 1 g by mouth.    . oxyCODONE-acetaminophen (PERCOCET/ROXICET) 5-325 MG tablet Take 1 tablet by mouth every 6 (six) hours as needed for severe pain. 25 tablet 0   No facility-administered medications prior to visit.     ROS   Objective:  Physical Exam   There were no vitals  filed for this visit.   on *** LPM *** RA BMI Readings from Last 3 Encounters:  10/07/17 17.38 kg/m  04/06/17 18.63 kg/m  04/06/17 18.60 kg/m   Wt Readings from Last 3 Encounters:  10/07/17 98 lb 1.6 oz (44.5 kg)  04/06/17 105 lb 2.6 oz (47.7 kg)  04/06/17 105 lb (47.6 kg)     CBC    Component Value Date/Time   WBC 7.0 04/06/2017 1710   RBC 4.62 04/06/2017 1710   HGB 14.6 10/07/2017 0935   HCT 43.1 04/06/2017 1710   PLT 162 04/06/2017 1710   MCV 93.3 04/06/2017 1710   MCH 31.6 04/06/2017 1710   MCHC 33.9 04/06/2017 1710   RDW 12.0 04/06/2017 1710   LYMPHSABS 2.4 04/06/2017 1710   MONOABS 0.4 04/06/2017 1710   EOSABS 0.1 04/06/2017 1710   BASOSABS 0.0 04/06/2017 1710    ***  Chest Imaging: 01/21/2018 chest x-ray: Significant hyperinflated lung fields.  Pulmonary Functions Testing Results: None   FeNO: None   Pathology: None   Echocardiogram: None   Heart Catheterization: None     Assessment & Plan:   Moderate persistent asthma without complication  Discussion: ***   Current Outpatient Medications:  .  ALPRAZolam (XANAX) 1 MG tablet, Take 1 tablet (1 mg total) by mouth 3 (three) times daily as needed for anxiety or sleep. Office visit needed, Disp: 90 tablet, Rfl: 0 .  cetirizine (ZYRTEC) 10 MG tablet, Take 10 mg by mouth daily., Disp: , Rfl:  .  EPINEPHrine (EPIPEN 2-PAK) 0.3 mg/0.3 mL IJ SOAJ injection, Inject 0.3 mLs (0.3 mg total) into the muscle once., Disp: 2 Device, Rfl: 1 .  Multiple Vitamin (THERA) TABS, Take by mouth., Disp: , Rfl:  .  Omega-3 1000 MG CAPS, Take 1 g by mouth., Disp: , Rfl:  .  oxyCODONE-acetaminophen (PERCOCET/ROXICET) 5-325 MG tablet, Take 1 tablet by mouth every 6 (six) hours as needed for severe pain., Disp: 25 tablet, Rfl: 0   Josephine Igo, DO [facility] 05/30/2018 8:37 AM

## 2018-06-02 ENCOUNTER — Encounter: Payer: Self-pay | Admitting: Pulmonary Disease

## 2018-06-02 ENCOUNTER — Ambulatory Visit: Payer: BLUE CROSS/BLUE SHIELD | Admitting: Pulmonary Disease

## 2018-06-02 VITALS — BP 124/80 | HR 74 | Ht 63.5 in | Wt 107.8 lb

## 2018-06-02 DIAGNOSIS — F17200 Nicotine dependence, unspecified, uncomplicated: Secondary | ICD-10-CM

## 2018-06-02 DIAGNOSIS — J4489 Other specified chronic obstructive pulmonary disease: Secondary | ICD-10-CM

## 2018-06-02 DIAGNOSIS — J449 Chronic obstructive pulmonary disease, unspecified: Secondary | ICD-10-CM | POA: Diagnosis not present

## 2018-06-02 DIAGNOSIS — R0781 Pleurodynia: Secondary | ICD-10-CM | POA: Diagnosis not present

## 2018-06-02 MED ORDER — TIOTROPIUM BROMIDE MONOHYDRATE 2.5 MCG/ACT IN AERS: 2.0000 | INHALATION_SPRAY | Freq: Every day | RESPIRATORY_TRACT | 0 refills | Status: DC

## 2018-06-02 MED ORDER — TIOTROPIUM BROMIDE MONOHYDRATE 2.5 MCG/ACT IN AERS: 2.0000 | INHALATION_SPRAY | Freq: Every day | RESPIRATORY_TRACT | 3 refills | Status: AC

## 2018-06-02 NOTE — Progress Notes (Signed)
Synopsis: Referred in 05/2018 for left lung pain  Subjective:   PATIENT ID: Makayla Reid GENDER: female DOB: Sep 08, 1970, MRN: [id number]   HPI  Chief Complaint  Patient presents with  . CONSULT    pain in left lung area began 4-5 years ago but over past year she has had persistent cough also.  Was told she had COPD.  Has hx of  "allergiy problems"    Makayla Reid is a 47 year old female who presents for a new patient visit for left lung pain and COPD.  She has had chronic left lung pain for the last 4-5 years and has worsened in the last two months. She describes her left lung is a dull, aching pain is aggravated by cough and allergies and will improve with activity. Two months ago, this pain has become sharp pain that occurs more frequently and worsens with deep breaths. She has tried rest, tylenol and bronchodilators without any relief. She wheezes and paroxysmal nocturnal dyspnea. Has non-productive cough  She has dyspnea with exertion including when vacuuming in her house. She also complains of shoulder pain bilaterally.  She is on Symbicort and Proventil. She intermittently uses her maintenance and rescue inhaler and but has been compliant for the last month. Uses rescue for 2-3 times daily.  Smoking 1ppd x 35 years Has cut down to 1 cigarette a day.  Denies fevers. Reports chills, diaphoresis and weight loss that has improved with prenatal vitamins, shortness of breath, headaches, reflux, abdominal pain, lower extremity edema  Past Medical History:  Diagnosis Date  . Anxiety   . Bilateral carpal tunnel syndrome 09/12/2017  . Complication of anesthesia    Severe migraine headache  . COPD (chronic obstructive pulmonary disease) (HCC)   . Depression   . GERD (gastroesophageal reflux disease)    History of  . History of hiatal hernia   . Kidney infection   . Reinke's edema of vocal folds   . Seasonal allergies   . Tobacco abuse disorder 07/13/2016  . Upper  respiratory infection 07/13/2016     Family History  Problem Relation Age of Onset  . Allergic rhinitis Mother   . Kidney disease Mother   . Allergic rhinitis Father   . Allergic rhinitis Brother   . Kidney disease Maternal Uncle   . Angioedema Neg Hx   . Asthma Neg Hx   . Atopy Neg Hx   . Eczema Neg Hx   . Immunodeficiency Neg Hx   . Urticaria Neg Hx      Social History   Socioeconomic History  . Marital status: Married    Spouse name: Not on file  . Number of children: Not on file  . Years of education: Not on file  . Highest education level: Not on file  Occupational History  . Not on file  Social Needs  . Financial resource strain: Not on file  . Food insecurity:    Worry: Not on file    Inability: Not on file  . Transportation needs:    Medical: Not on file    Non-medical: Not on file  Tobacco Use  . Smoking status: Current Every Day Smoker    Packs/day: 0.25    Years: 33.00    Pack years: 8.25    Types: Cigarettes  . Smokeless tobacco: Never Used  . Tobacco comment: one cigarette daily as of Oct 2019  Substance and Sexual Activity  . Alcohol use: Yes    Comment: socially  .  Drug use: No  . Sexual activity: Not on file  Lifestyle  . Physical activity:    Days per week: Not on file    Minutes per session: Not on file  . Stress: Not on file  Relationships  . Social connections:    Talks on phone: Not on file    Gets together: Not on file    Attends religious service: Not on file    Active member of club or organization: Not on file    Attends meetings of clubs or organizations: Not on file    Relationship status: Not on file  . Intimate partner violence:    Fear of current or ex partner: Not on file    Emotionally abused: Not on file    Physically abused: Not on file    Forced sexual activity: Not on file  Other Topics Concern  . Not on file  Social History Narrative  . Not on file     Allergies  Allergen Reactions  . Milk-Related  Compounds Nausea And Vomiting  . Penicillins Rash     Outpatient Medications Prior to Visit  Medication Sig Dispense Refill  . albuterol (PROAIR HFA) 108 (90 Base) MCG/ACT inhaler Inhale 1-2 puffs into the lungs every 6 (six) hours as needed for wheezing or shortness of breath.    . ALPRAZolam (XANAX) 1 MG tablet Take 1 tablet (1 mg total) by mouth 3 (three) times daily as needed for anxiety or sleep. Office visit needed 90 tablet 0  . cetirizine (ZYRTEC) 10 MG tablet Take 10 mg by mouth daily.    . Multiple Vitamin (THERA) TABS Take by mouth.    . Omega-3 1000 MG CAPS Take 1 g by mouth.    . oxyCODONE-acetaminophen (PERCOCET/ROXICET) 5-325 MG tablet Take 1 tablet by mouth every 6 (six) hours as needed for severe pain. 25 tablet 0  . EPINEPHrine (EPIPEN 2-PAK) 0.3 mg/0.3 mL IJ SOAJ injection Inject 0.3 mLs (0.3 mg total) into the muscle once. 2 Device 1  . SYMBICORT 160-4.5 MCG/ACT inhaler Inhale 2 puffs into the lungs 2 (two) times daily.  2   No facility-administered medications prior to visit.     Review of Systems  Constitutional: Positive for chills, diaphoresis and weight loss. Negative for fever and malaise/fatigue.  HENT: Negative for congestion and sore throat.   Respiratory: Positive for shortness of breath. Negative for cough, hemoptysis, sputum production and wheezing.   Cardiovascular: Positive for palpitations. Negative for chest pain, orthopnea, leg swelling and PND.  Gastrointestinal: Negative for abdominal pain, heartburn and nausea.  Genitourinary: Negative for frequency.  Musculoskeletal: Positive for myalgias.  Skin: Positive for rash.  Neurological: Positive for headaches. Negative for dizziness and weakness.  Endo/Heme/Allergies: Does not bruise/bleed easily.  Psychiatric/Behavioral: Positive for depression.      Objective:  Physical Exam  Constitutional: She is oriented to person, place, and time. She appears well-developed and well-nourished. No distress.    HENT:  Head: Normocephalic and atraumatic.  Nose: Nose normal.  Mouth/Throat: No oropharyngeal exudate.  Eyes: Pupils are equal, round, and reactive to light. Conjunctivae and EOM are normal. No scleral icterus.  Neck: Normal range of motion. Neck supple. No JVD present. No tracheal deviation present.  Cardiovascular: Normal rate, regular rhythm, normal heart sounds and intact distal pulses. Exam reveals no gallop and no friction rub.  No murmur heard. Pulmonary/Chest: Effort normal and breath sounds normal. No stridor. No respiratory distress. She has no wheezes. She has no rales.  Abdominal: Soft. Bowel sounds are normal. She exhibits no distension. There is no tenderness.  Musculoskeletal: Normal range of motion. She exhibits no edema or deformity.  Lymphadenopathy:    She has no cervical adenopathy.  Neurological: She is alert and oriented to person, place, and time. No cranial nerve deficit or sensory deficit.  Skin: Skin is warm and dry. Rash (multiple erythematous raised non-tender lesions on eft anterior neck, resolving) noted. She is not diaphoretic. No erythema. No pallor.  Psychiatric: She has a normal mood and affect. Her behavior is normal. Judgment and thought content normal.  Vitals reviewed.    Vitals:   06/02/18 0903  BP: 124/80  Pulse: 74  SpO2: 98%  Weight: 107 lb 12.8 oz (48.9 kg)  Height: 5' 3.5" (1.613 m)    CBC    Component Value Date/Time   WBC 7.0 04/06/2017 1710   RBC 4.62 04/06/2017 1710   HGB 14.6 10/07/2017 0935   HCT 43.1 04/06/2017 1710   PLT 162 04/06/2017 1710   MCV 93.3 04/06/2017 1710   MCH 31.6 04/06/2017 1710   MCHC 33.9 04/06/2017 1710   RDW 12.0 04/06/2017 1710   LYMPHSABS 2.4 04/06/2017 1710   MONOABS 0.4 04/06/2017 1710   EOSABS 0.1 04/06/2017 1710   BASOSABS 0.0 04/06/2017 1710     Chest imaging: CXR 01/20/18 - Marked hyperinflation of the lungs, similar to prior CXRs on file  PFT: OSH Record 11/09/16 - Shows moderate  obstructive defect with significant bronchodilator response   Echo: None on file  I have personally reviewed the labs, images and test results listed above.     Assessment & Plan:   COPD with asthma (HCC)  Pleuritic chest pain  Tobacco use disorder  Discussion: 47 year old female with history of COPD who presents with worsening left sided chest pain. Subacute on chronic presentation of mild left-sided chest pain that is reproducible with palpation on exam. Suspect this may be musculoskeletal etiology from chronic cough; doubt ACS, PE or infection based on clinical history. PFTs demonstrate COPD with significant bronchodilator response consistent with asthma overlap. Discussed compliance with bronchodilators and will add Spiriva for triple therapy. Will evaluate at next visit.  Left sided chest pain, mild No further work-up warranted at this time. Will consider chest imaging if pain persists after optimizing medical management  COPD with asthma overlap START Spiriva Respimat 2.57mcg 2 puffs once a day. Prescription provided Continue Symbicort 160-4.5 mcg 2 puffs twice a day Take albuterol every 3-4 hours as needed for shortness of breath or wheezing.   Tobacco abuse Patient is an active smoker. We discussed smoking cessation for <3 minutes. Provided patient with information regarding risks, cessation techniques, and interventions. She is contemplating quitting and down to 1 cigarette/day  Allergic Rhinitis Continue zyrtec 10 mg daily  Follow-up in 3 months   Vaccinations Influenza vaccine due   If you experience worsening shortness of breath or wheezing that does not improve with medical therapy, please seek medical attention immediately.   Chi Mechele Collin, MD [facility] 06/02/2018 10:03 AM  Personal pager: [phone] If unanswered, please page [facility]CM On-call: #[phone]    Current Outpatient Medications:  .  albuterol (PROAIR HFA) 108 (90  Base) MCG/ACT inhaler, Inhale 1-2 puffs into the lungs every 6 (six) hours as needed for wheezing or shortness of breath., Disp: , Rfl:  .  ALPRAZolam (XANAX) 1 MG tablet, Take 1 tablet (1 mg total) by mouth 3 (three) times daily as needed  for anxiety or sleep. Office visit needed, Disp: 90 tablet, Rfl: 0 .  cetirizine (ZYRTEC) 10 MG tablet, Take 10 mg by mouth daily., Disp: , Rfl:  .  Multiple Vitamin (THERA) TABS, Take by mouth., Disp: , Rfl:  .  Omega-3 1000 MG CAPS, Take 1 g by mouth., Disp: , Rfl:  .  EPINEPHrine (EPIPEN 2-PAK) 0.3 mg/0.3 mL IJ SOAJ injection, Inject 0.3 mLs (0.3 mg total) into the muscle once., Disp: 2 Device, Rfl: 1 .  SYMBICORT 160-4.5 MCG/ACT inhaler, Inhale 2 puffs into the lungs 2 (two) times daily., Disp: , Rfl: 2

## 2018-06-02 NOTE — Patient Instructions (Signed)
Left sided chest pain No further work-up warranted at this time. Will consider chest imaging if pain persists after optimizing medical management  COPD with asthma overlap START Spiriva Respimat 2.29mcg 2 puffs once a day. Prescription provided Continue Symbicort 160-4.5 mcg 2 puffs twice a day Take albuterol every 3-4 hours as needed for shortness of breath or wheezing.   Tobacco abuse Patient is an active smoker. We discussed smoking cessation for <3 minutes. Provided patient with information regarding risks, cessation techniques, and interventions. She is contemplating quitting and down to 1 cigarette/day   Follow-up in 3 months

## 2018-06-12 ENCOUNTER — Other Ambulatory Visit (INDEPENDENT_AMBULATORY_CARE_PROVIDER_SITE_OTHER): Payer: BLUE CROSS/BLUE SHIELD

## 2018-06-12 ENCOUNTER — Encounter: Payer: Self-pay | Admitting: Primary Care

## 2018-06-12 ENCOUNTER — Ambulatory Visit (INDEPENDENT_AMBULATORY_CARE_PROVIDER_SITE_OTHER): Payer: BLUE CROSS/BLUE SHIELD | Admitting: Primary Care

## 2018-06-12 VITALS — BP 116/80 | HR 85 | Temp 97.9°F | Ht 63.5 in | Wt 102.8 lb

## 2018-06-12 DIAGNOSIS — R0781 Pleurodynia: Secondary | ICD-10-CM | POA: Diagnosis not present

## 2018-06-12 DIAGNOSIS — R0789 Other chest pain: Secondary | ICD-10-CM | POA: Diagnosis not present

## 2018-06-12 LAB — CBC WITH DIFFERENTIAL/PLATELET
BASOS PCT: 1.1 % (ref 0.0–3.0)
Basophils Absolute: 0.1 10*3/uL (ref 0.0–0.1)
EOS ABS: 0.2 10*3/uL (ref 0.0–0.7)
Eosinophils Relative: 3.3 % (ref 0.0–5.0)
HEMATOCRIT: 44.2 % (ref 36.0–46.0)
Hemoglobin: 15 g/dL (ref 12.0–15.0)
LYMPHS ABS: 2.7 10*3/uL (ref 0.7–4.0)
LYMPHS PCT: 47.4 % — AB (ref 12.0–46.0)
MCHC: 34 g/dL (ref 30.0–36.0)
MCV: 94.5 fl (ref 78.0–100.0)
MONOS PCT: 5.3 % (ref 3.0–12.0)
Monocytes Absolute: 0.3 10*3/uL (ref 0.1–1.0)
NEUTROS ABS: 2.5 10*3/uL (ref 1.4–7.7)
NEUTROS PCT: 42.9 % — AB (ref 43.0–77.0)
PLATELETS: 163 10*3/uL (ref 150.0–400.0)
RBC: 4.68 Mil/uL (ref 3.87–5.11)
RDW: 12.6 % (ref 11.5–15.5)
WBC: 5.7 10*3/uL (ref 4.0–10.5)

## 2018-06-12 LAB — D-DIMER, QUANTITATIVE: D-Dimer, Quant: <0.19 mcg/mL FEU (ref <0.50)

## 2018-06-12 LAB — BASIC METABOLIC PANEL
BUN: 7 mg/dL (ref 6–23)
CALCIUM: 10 mg/dL (ref 8.4–10.5)
CHLORIDE: 105 meq/L (ref 96–112)
CO2: 30 mEq/L (ref 19–32)
CREATININE: 0.74 mg/dL (ref 0.40–1.20)
GFR: 88.42 mL/min (ref >60.00)
GLUCOSE: 84 mg/dL (ref 70–99)
Potassium: 4.2 mEq/L (ref 3.5–5.1)
Sodium: 142 mEq/L (ref 135–145)

## 2018-06-12 MED ORDER — IBUPROFEN 800 MG PO TABS: 800.0000 mg | ORAL_TABLET | Freq: Three times a day (TID) | ORAL | 0 refills | Status: DC | PRN

## 2018-06-12 NOTE — Progress Notes (Signed)
@Patient  ID: Makayla Reid, female    DOB: [birth year], 47 y.o.   MRN: [id number]  Chief Complaint  Patient presents with  . Follow-up    pain in left lung and neck, getting worse, headaches, night sweats and chills    Referring provider: Mickle Plumb, NP  HPI: 47 year old female, current smoker. PMH significant for COPD with asthma overlap, allergic rhinitis. Seen for initial consult by Dr. Everardo All on 10/18. Continues Symbicort 160 and prn albuterol hfa. Started on Spiriva respimat. CXR is June 2019 showed COPD, large lung volumes with depressed diaphargm.   06/12/2018 Patient presents today for acute visit, accompanied by her husband. Complains of ongoing left lung pain, neck pain. Associated HA, night sweats and chills. Reports that she has been having left sided lung pain for the last 3-4 years. Tender to touch and hurts to take a deep breath. Pain occasionally radiates to left side of her neck, not currently experiencing. Feels her breathing has improved with addition of Spiriva. She has not needed rescue inhaler. Denies chest pain, adb pain, cough or wheeze.    Allergies  Allergen Reactions  . Milk-Related Compounds Nausea And Vomiting  . Penicillins Rash    Immunization History  Administered Date(s) Administered  . Tetanus 08/17/2011    Past Medical History:  Diagnosis Date  . Anxiety   . Bilateral carpal tunnel syndrome 09/12/2017  . Complication of anesthesia    Severe migraine headache  . COPD (chronic obstructive pulmonary disease) (HCC)   . Depression   . GERD (gastroesophageal reflux disease)    History of  . History of hiatal hernia   . Kidney infection   . Reinke's edema of vocal folds   . Seasonal allergies   . Tobacco abuse disorder 07/13/2016  . Upper respiratory infection 07/13/2016    Tobacco History: Social History   Tobacco Use  Smoking Status Current Every Day Smoker  . Packs/day: 0.25  . Years: 33.00  . Pack years: 8.25  . Types:  Cigarettes  Smokeless Tobacco Never Used  Tobacco Comment   one cigarette daily as of Oct 2019   Ready to quit: Not Answered Counseling given: Not Answered Comment: one cigarette daily as of Oct 2019   Outpatient Medications Prior to Visit  Medication Sig Dispense Refill  . albuterol (PROAIR HFA) 108 (90 Base) MCG/ACT inhaler Inhale 1-2 puffs into the lungs every 6 (six) hours as needed for wheezing or shortness of breath.    . ALPRAZolam (XANAX) 1 MG tablet Take 1 tablet (1 mg total) by mouth 3 (three) times daily as needed for anxiety or sleep. Office visit needed 90 tablet 0  . cetirizine (ZYRTEC) 10 MG tablet Take 10 mg by mouth daily.    . Multiple Vitamin (THERA) TABS Take by mouth.    . Omega-3 1000 MG CAPS Take 1 g by mouth.    . SYMBICORT 160-4.5 MCG/ACT inhaler Inhale 2 puffs into the lungs 2 (two) times daily.  2  . Tiotropium Bromide Monohydrate (SPIRIVA RESPIMAT) 2.5 MCG/ACT AERS Inhale 2 puffs into the lungs daily. 1 Inhaler 3  . EPINEPHrine (EPIPEN 2-PAK) 0.3 mg/0.3 mL IJ SOAJ injection Inject 0.3 mLs (0.3 mg total) into the muscle once. 2 Device 1  . Tiotropium Bromide Monohydrate (SPIRIVA RESPIMAT) 2.5 MCG/ACT AERS Inhale 2 puffs into the lungs daily for 1 day. 1 Inhaler 0   No facility-administered medications prior to visit.     Review of Systems  Review of Systems  Constitutional: Negative.   HENT: Negative.   Respiratory: Negative for cough, chest tightness, shortness of breath and wheezing.        Left sided "lung" pain, tender to touch   Cardiovascular: Negative.     Physical Exam  BP 116/80 (BP Location: Right Arm, Cuff Size: Normal)   Pulse 85   Temp 97.9 F (36.6 C)   Ht 5' 3.5" (1.613 m)   Wt 102 lb 12.8 oz (46.6 kg)   LMP 04/02/2014 Comment: tubal ligation  SpO2 97%   BMI 17.92 kg/m  Physical Exam  Constitutional: She is oriented to person, place, and time. She appears well-developed and well-nourished. No distress.  HENT:  Head:  Normocephalic and atraumatic.  Eyes: Pupils are equal, round, and reactive to light. EOM are normal.  Neck: Normal range of motion. Neck supple.  Cardiovascular: Normal rate and regular rhythm.  Pulmonary/Chest: Effort normal and breath sounds normal. She has no wheezes.  CTA  Musculoskeletal: Normal range of motion.  Left rib cage tender to touch  Neurological: She is alert and oriented to person, place, and time.  Skin: Skin is warm and dry.  Mild diffuse rash left neck   Psychiatric: She has a normal mood and affect. Her behavior is normal. Judgment and thought content normal.  Anxious     Lab Results:  CBC    Component Value Date/Time   WBC 5.7 06/12/2018 1443   RBC 4.68 06/12/2018 1443   HGB 15.0 06/12/2018 1443   HCT 44.2 06/12/2018 1443   PLT 163.0 06/12/2018 1443   MCV 94.5 06/12/2018 1443   MCH 31.6 04/06/2017 1710   MCHC 34.0 06/12/2018 1443   RDW 12.6 06/12/2018 1443   LYMPHSABS 2.7 06/12/2018 1443   MONOABS 0.3 06/12/2018 1443   EOSABS 0.2 06/12/2018 1443   BASOSABS 0.1 06/12/2018 1443    BMET    Component Value Date/Time   NA 142 06/12/2018 1443   K 4.2 06/12/2018 1443   CL 105 06/12/2018 1443   CO2 30 06/12/2018 1443   GLUCOSE 84 06/12/2018 1443   BUN 7 06/12/2018 1443   CREATININE 0.74 06/12/2018 1443   CALCIUM 10.0 06/12/2018 1443   GFRNONAA >60 04/06/2017 1710   GFRAA >60 04/06/2017 1710    BNP No results found for: BNP  ProBNP No results found for: PROBNP  Imaging: No results found.   Assessment & Plan:    Chest pain, pleuritic - Acute on chronic left pleuritic pain, worsening  - CXR in June showed COPD, large lung vol with depressed diaphragm  - EKG showed normal sinus rhythm  - Left rib cage tender to touch, likely musculoskeletal - Recommend Ibuprofen 800mg  three times a day as needed for pleuritic pain. Advised to take with food  - CBC, BMET and Ddimer normal  - Needs CT Chest with contrast d/t duration of symptoms, tobacco  abuse and normal labs/EKG  Chronic obstructive pulmonary disease (HCC) - COPD with asthma overlap. Current smoker.  - Continue Symbicort 2 puffs twice daily, Spiriva 2 puffs once daily  - Albuterol rescue inhaler 2 puffs every 4-6 hr for sob/wheezing  - Smoking cessation strongly encouraged   Glenford Bayley, NP 06/12/2018

## 2018-06-12 NOTE — Patient Instructions (Addendum)
Orders: - Please get labs today  (once resulted will go ahead and order Chest CT)  Office procedure: - EKG showed normal sinus rhythm   RX: - Ibuprofen 800mg  three times a day as needed for pleuritic pain  - Take with food. Do not exceed 2,400mg  daily and do not take longer than 1 week   COPD/asthma:  Continues Symbicort 2 puffs twice daily Spiriva 2 puffs once daily  Albuterol rescue inhaler 2 puffs every 4-6 hr for sob/wheezing   Follow up with PCP regarding neck pain and rash

## 2018-06-12 NOTE — Assessment & Plan Note (Addendum)
-   Acute on chronic left pleuritic pain, worsening  - CXR in June showed COPD, large lung vol with depressed diaphragm  - EKG showed normal sinus rhythm  - Left rib cage tender to touch, likely musculoskeletal - Recommend Ibuprofen 800mg  three times a day as needed for pleuritic pain. Advised to take with food  - CBC, BMET and Ddimer normal  - Needs CT Chest with contrast d/t duration of symptoms, tobacco abuse and normal labs/EKG

## 2018-06-12 NOTE — Assessment & Plan Note (Addendum)
-   COPD with asthma overlap. Current smoker.  - Continue Symbicort 2 puffs twice daily, Spiriva 2 puffs once daily  - Albuterol rescue inhaler 2 puffs every 4-6 hr for sob/wheezing  - Smoking cessation strongly encouraged

## 2018-06-13 ENCOUNTER — Telehealth: Payer: Self-pay | Admitting: Primary Care

## 2018-06-13 NOTE — Telephone Encounter (Signed)
Patient was returning a call from our office from [facility] to schedule the CT appointment.   I let patient know I would route to the Patient Care Coordinators and they would be contacting her back.  Will route to [facility] for further follow up.

## 2018-06-13 NOTE — Telephone Encounter (Signed)
Spoke to pt & gave her appt info.  Nothing further needed. 

## 2018-06-15 ENCOUNTER — Other Ambulatory Visit (INDEPENDENT_AMBULATORY_CARE_PROVIDER_SITE_OTHER): Payer: BLUE CROSS/BLUE SHIELD

## 2018-06-15 ENCOUNTER — Ambulatory Visit (HOSPITAL_BASED_OUTPATIENT_CLINIC_OR_DEPARTMENT_OTHER)
Admission: RE | Admit: 2018-06-15 | Discharge: 2018-06-15 | Disposition: A | Discharge status: Home / Self Care | Payer: BLUE CROSS/BLUE SHIELD | Source: Ambulatory Visit | Attending: Primary Care | Admitting: Primary Care

## 2018-06-15 DIAGNOSIS — R0789 Other chest pain: Secondary | ICD-10-CM | POA: Insufficient documentation

## 2018-06-15 DIAGNOSIS — J449 Chronic obstructive pulmonary disease, unspecified: Secondary | ICD-10-CM | POA: Diagnosis not present

## 2018-06-15 DIAGNOSIS — R0781 Pleurodynia: Secondary | ICD-10-CM | POA: Diagnosis not present

## 2018-06-15 LAB — HEPATIC FUNCTION PANEL
ALK PHOS: 60 U/L (ref 39–117)
ALT: 25 U/L (ref 0–35)
AST: 32 U/L (ref 0–37)
Albumin: 4.6 g/dL (ref 3.5–5.2)
BILIRUBIN TOTAL: 0.4 mg/dL (ref 0.2–1.2)
Bilirubin, Direct: 0.1 mg/dL (ref 0.0–0.3)
Total Protein: 7.1 g/dL (ref 6.0–8.3)

## 2018-06-15 MED ORDER — IOPAMIDOL (ISOVUE-300) INJECTION 61%
100.0000 mL | Freq: Once | INTRAVENOUS | Status: AC | PRN
  Administered 2018-06-15: 80 mL via INTRAVENOUS

## 2018-06-16 ENCOUNTER — Telehealth: Payer: Self-pay | Admitting: Primary Care

## 2018-06-16 ENCOUNTER — Ambulatory Visit (HOSPITAL_BASED_OUTPATIENT_CLINIC_OR_DEPARTMENT_OTHER): Payer: BLUE CROSS/BLUE SHIELD

## 2018-06-16 NOTE — Telephone Encounter (Signed)
Attempted to call Patient with lab results.  Left message for Patient to call back.

## 2018-06-16 NOTE — Telephone Encounter (Signed)
Misty Stanley called and spoke with patient

## 2018-06-16 NOTE — Telephone Encounter (Signed)
Patient called back requesting lab results. Results of LFT given.  Patient stated that she does not understand how her liver functions can be normal, because her eyes are yellow and puffy.  Patient stated that she needs a liver biopsy ASAP.  She wanted to know if there were any other labs pending.  Patient is requesting more information, because she feels that she is a very sick person.  Will route message to Ames Dura, NP as Lorain Childes

## 2018-06-20 NOTE — Telephone Encounter (Signed)
Please see 06/15/18 lab result.  Notes recorded by Earvin Hansen, RN on 06/16/2018 at 1:47 PM EDT Relayed results to pt. She has spoken to her PCP and will get referral to hepatic specialist. Nothing further needed at this time.  Pt states she has an appointment with hepatic specialist on 06/29/18. Nothing further is needed.

## 2018-07-12 ENCOUNTER — Emergency Department (HOSPITAL_COMMUNITY)
Admission: EM | Admit: 2018-07-12 | Discharge: 2018-07-12 | Disposition: A | Discharge status: Home / Self Care | Payer: BLUE CROSS/BLUE SHIELD | Attending: Emergency Medicine | Admitting: Emergency Medicine

## 2018-07-12 ENCOUNTER — Other Ambulatory Visit: Payer: Self-pay

## 2018-07-12 ENCOUNTER — Emergency Department (HOSPITAL_COMMUNITY): Payer: BLUE CROSS/BLUE SHIELD

## 2018-07-12 DIAGNOSIS — K921 Melena: Secondary | ICD-10-CM | POA: Insufficient documentation

## 2018-07-12 DIAGNOSIS — R1030 Lower abdominal pain, unspecified: Secondary | ICD-10-CM | POA: Insufficient documentation

## 2018-07-12 DIAGNOSIS — Z79899 Other long term (current) drug therapy: Secondary | ICD-10-CM | POA: Diagnosis not present

## 2018-07-12 DIAGNOSIS — F17210 Nicotine dependence, cigarettes, uncomplicated: Secondary | ICD-10-CM | POA: Insufficient documentation

## 2018-07-12 DIAGNOSIS — J45909 Unspecified asthma, uncomplicated: Secondary | ICD-10-CM | POA: Diagnosis not present

## 2018-07-12 DIAGNOSIS — Z789 Other specified health status: Secondary | ICD-10-CM

## 2018-07-12 DIAGNOSIS — R109 Unspecified abdominal pain: Secondary | ICD-10-CM | POA: Diagnosis present

## 2018-07-12 LAB — URINALYSIS, ROUTINE W REFLEX MICROSCOPIC
BILIRUBIN URINE: NEGATIVE
GLUCOSE, UA: NEGATIVE mg/dL
HGB URINE DIPSTICK: NEGATIVE
Ketones, ur: NEGATIVE mg/dL
Leukocytes, UA: NEGATIVE
Nitrite: NEGATIVE
Protein, ur: NEGATIVE mg/dL
SPECIFIC GRAVITY, URINE: 1.003 — AB (ref 1.005–1.030)
pH: 7 (ref 5.0–8.0)

## 2018-07-12 LAB — COMPREHENSIVE METABOLIC PANEL
ALT: 31 U/L (ref 0–44)
AST: 32 U/L (ref 15–41)
Albumin: 4.6 g/dL (ref 3.5–5.0)
Alkaline Phosphatase: 69 U/L (ref 38–126)
Anion gap: 4 — ABNORMAL LOW (ref 5–15)
BUN: 11 mg/dL (ref 6–20)
CALCIUM: 9.4 mg/dL (ref 8.9–10.3)
CHLORIDE: 107 mmol/L (ref 98–111)
CO2: 29 mmol/L (ref 22–32)
Creatinine, Ser: 0.58 mg/dL (ref 0.44–1.00)
GLUCOSE: 82 mg/dL (ref 70–99)
Potassium: 3.5 mmol/L (ref 3.5–5.1)
Sodium: 140 mmol/L (ref 135–145)
TOTAL PROTEIN: 7.5 g/dL (ref 6.5–8.1)
Total Bilirubin: 0.7 mg/dL (ref 0.3–1.2)

## 2018-07-12 LAB — LIPASE, BLOOD: LIPASE: 44 U/L (ref 11–51)

## 2018-07-12 LAB — CBC
HCT: 42.9 % (ref 36.0–46.0)
HEMOGLOBIN: 13.9 g/dL (ref 12.0–15.0)
MCH: 31.2 pg (ref 26.0–34.0)
MCHC: 32.4 g/dL (ref 30.0–36.0)
MCV: 96.2 fL (ref 80.0–100.0)
Platelets: 240 10*3/uL (ref 150–400)
RBC: 4.46 MIL/uL (ref 3.87–5.11)
RDW: 12.6 % (ref 11.5–15.5)
WBC: 7.4 10*3/uL (ref 4.0–10.5)
nRBC: 0 % (ref 0.0–0.2)

## 2018-07-12 MED ORDER — KETOROLAC TROMETHAMINE 30 MG/ML IJ SOLN
30.0000 mg | Freq: Once | INTRAMUSCULAR | Status: AC
  Administered 2018-07-12: 30 mg via INTRAVENOUS
  Filled 2018-07-12: qty 1

## 2018-07-12 NOTE — ED Triage Notes (Signed)
Pt reports she has been having groin tenderness and abdominal tenderness. Pt reports dark stool and fatigue. Pt reports she has seen her GI doctor multiple times and had 2 CT scans in which she was told she had ulcers. She was given medication for acid reflux and ended up with GERD reportedly.  Pt is requesting and ultrasound as she said she had a colon repair 7 years ago and the ultrasound is what found the problem originally.

## 2018-07-12 NOTE — ED Notes (Signed)
Topaz pad broken

## 2018-07-12 NOTE — ED Notes (Signed)
Patient states pain is "over 10"

## 2018-07-12 NOTE — Discharge Instructions (Addendum)
Continue your current medications.  Follow-up with your GI doctor for further evaluation

## 2018-07-12 NOTE — ED Provider Notes (Signed)
[facility]-EMERGENCY DEPT Provider Note   CSN: [id number] Arrival date & time: 07/12/18  1658     History   Chief Complaint Chief Complaint  Patient presents with  . Abdominal Pain  . Melena    HPI Makayla Reid is a 47 y.o. female.  HPI Pt has been feeling weak for several months.  She has had issues with dark black stools.  She has seen a GI doctor, Dr Wynelle Beckmann,  and has had endoscopy and a colonoscopy and diagnosed with ulcers.  Pt states she also had a biopsy and was told she did not have cancers.  Pt was prescribed a PPI.  It did not help and increased her dose. SHe ended up developing a reaction of thrush.  Pt states she is still not feeling better.  Her stools are very dark.  Her abdomen started hurting severely and then she had very dark stools and became very weak.  The pain is burning on the left abdomen.  Pt did have a CT scan of her abdomen on 06/26/2018.  No acute findings noted.   Pt is really concerned about her abd pain.  She wants an abd ultrasound since that is how they found out about her prior hernia and intestinal obstruction.   Past Medical History:  Diagnosis Date  . Anxiety   . Bilateral carpal tunnel syndrome 09/12/2017  . Complication of anesthesia    Severe migraine headache  . COPD (chronic obstructive pulmonary disease) (HCC)   . Depression   . GERD (gastroesophageal reflux disease)    History of  . History of hiatal hernia   . Kidney infection   . Reinke's edema of vocal folds   . Seasonal allergies   . Tobacco abuse disorder 07/13/2016  . Upper respiratory infection 07/13/2016    Patient Active Problem List   Diagnosis Date Noted  . Chest pain, pleuritic 06/02/2018  . Carpal tunnel syndrome on right 10/07/2017  . Bilateral carpal tunnel syndrome 09/12/2017  . Perennial and seasonal allergic rhinitis 11/09/2016  . Moderate persistent asthma 11/09/2016  . Allergic conjunctivitis 11/09/2016  . Dermatitis 11/09/2016  .  Tobacco abuse disorder 07/13/2016  . Depression 03/31/2016  . Chronic obstructive pulmonary disease (HCC) 03/31/2016  . HA (headache) 05/19/2013    Past Surgical History:  Procedure Laterality Date  . ABDOMINAL HYSTERECTOMY    . CARPAL TUNNEL RELEASE Right 10/07/2017   Procedure: CARPAL TUNNEL RELEASE;  Surgeon: Jodi Geralds, MD;  Location: [facility];  Service: Orthopedics;  Laterality: Right;  . CHOLECYSTECTOMY    . DIAGNOSTIC LAPAROSCOPY  09/16/1999   Dr. Trudie Reed, biopsy of left uterosacral ligament and left uterosacral nerve ablation  . MICROLARYNGOSCOPY    . TONSILLECTOMY    . TUBAL LIGATION       OB History   None      Home Medications    Prior to Admission medications   Medication Sig Start Date End Date Taking? Authorizing Provider  acetaminophen (TYLENOL) 500 MG tablet Take 500 mg by mouth every 6 (six) hours as needed for moderate pain.   Yes [provider]  albuterol (PROAIR HFA) 108 (90 Base) MCG/ACT inhaler Inhale 1-2 puffs into the lungs every 6 (six) hours as needed for wheezing or shortness of breath.   Yes [provider]  ALPRAZolam Prudy Feeler) 1 MG tablet Take 1 tablet (1 mg total) by mouth 3 (three) times daily as needed for anxiety or sleep. Office visit needed Patient taking  differently: Take 1 mg by mouth 4 (four) times daily. Office visit needed 07/19/16  Yes Copland, Gwenlyn Found, MD  cetirizine (ZYRTEC) 10 MG tablet Take 10 mg by mouth daily.   Yes [provider]  citalopram (CELEXA) 10 MG tablet Take 10 mg by mouth daily.  07/04/18  Yes [provider]  nystatin (MYCOSTATIN) 100000 UNIT/ML suspension Take 5 mLs by mouth 3 (three) times daily.  07/06/18  Yes [provider]  Omega-3 1000 MG CAPS Take 1 g by mouth daily.    Yes [provider]  Prenatal Vit-Fe Fumarate-FA (MULTIVITAMIN-PRENATAL) 27-0.8 MG TABS tablet Take 1 tablet by mouth daily at 12 noon.   Yes [provider]   SYMBICORT 160-4.5 MCG/ACT inhaler Inhale 2 puffs into the lungs 2 (two) times daily. 05/18/18  Yes [provider]  Tiotropium Bromide Monohydrate (SPIRIVA RESPIMAT) 2.5 MCG/ACT AERS Inhale 2 puffs into the lungs daily. 06/02/18  Yes Luciano Cutter, MD  EPINEPHrine (EPIPEN 2-PAK) 0.3 mg/0.3 mL IJ SOAJ injection Inject 0.3 mLs (0.3 mg total) into the muscle once. 11/11/16 11/11/16  Bobbitt, Heywood Iles, MD  ibuprofen (ADVIL,MOTRIN) 800 MG tablet Take 1 tablet (800 mg total) by mouth every 8 (eight) hours as needed. Patient not taking: Reported on 07/12/2018 06/12/18   Glenford Bayley, NP  lidocaine (XYLOCAINE) 2 % solution Use as directed 15 mLs in the mouth or throat every 6 (six) hours as needed for mouth pain.  07/06/18   [provider]  Tiotropium Bromide Monohydrate (SPIRIVA RESPIMAT) 2.5 MCG/ACT AERS Inhale 2 puffs into the lungs daily for 1 day. 06/02/18 06/03/18  Luciano Cutter, MD    Family History Family History  Problem Relation Age of Onset  . Allergic rhinitis Mother   . Kidney disease Mother   . Allergic rhinitis Father   . Allergic rhinitis Brother   . Kidney disease Maternal Uncle   . Angioedema Neg Hx   . Asthma Neg Hx   . Atopy Neg Hx   . Eczema Neg Hx   . Immunodeficiency Neg Hx   . Urticaria Neg Hx     Social History Social History   Tobacco Use  . Smoking status: Current Every Day Smoker    Packs/day: 0.25    Years: 33.00    Pack years: 8.25    Types: Cigarettes  . Smokeless tobacco: Never Used  . Tobacco comment: one cigarette daily as of Oct 2019  Substance Use Topics  . Alcohol use: Yes    Comment: socially  . Drug use: No     Allergies   Milk-related compounds and Penicillins   Review of Systems Review of Systems  All other systems reviewed and are negative.    Physical Exam Updated Vital Signs BP (!) 119/107 (BP Location: Right Arm)   Pulse 67   Temp 98 F (36.7 C)   Resp 18   LMP 04/02/2014 Comment:  tubal ligation  SpO2 99%   Physical Exam  Constitutional: She appears well-developed and well-nourished. No distress.  HENT:  Head: Normocephalic and atraumatic.  Right Ear: External ear normal.  Left Ear: External ear normal.  Eyes: Conjunctivae are normal. Right eye exhibits no discharge. Left eye exhibits no discharge. No scleral icterus.  Neck: Neck supple. No tracheal deviation present.  Cardiovascular: Normal rate, regular rhythm and intact distal pulses.  Pulmonary/Chest: Effort normal and breath sounds normal. No stridor. No respiratory distress. She has no wheezes. She has no rales.  Abdominal: Soft.  Bowel sounds are normal. She exhibits no distension. There is tenderness in the left lower quadrant. There is no rebound and no guarding.  ?incisional hernia llq, mild fullness  Musculoskeletal: She exhibits no edema or tenderness.  Neurological: She is alert. She has normal strength. No cranial nerve deficit (no facial droop, extraocular movements intact, no slurred speech) or sensory deficit. She exhibits normal muscle tone. She displays no seizure activity. Coordination normal.  Skin: Skin is warm and dry. No rash noted.  Psychiatric: She has a normal mood and affect.  Nursing note and vitals reviewed.    ED Treatments / Results  Labs (all labs ordered are listed, but only abnormal results are displayed) Labs Reviewed  COMPREHENSIVE METABOLIC PANEL - Abnormal; Notable for the following components:      Result Value   Anion gap 4 (*)    All other components within normal limits  URINALYSIS, ROUTINE W REFLEX MICROSCOPIC - Abnormal; Notable for the following components:   Color, Urine STRAW (*)    Specific Gravity, Urine 1.003 (*)    All other components within normal limits  LIPASE, BLOOD  CBC    EKG None  Radiology [place] Pelvis Limited (transabdominal Only)  Result Date: 07/12/2018 CLINICAL DATA:  Abdominal pain with swelling in the left groin. History of colon  surgery. Dark stools and fatigue. EXAM: LIMITED ULTRASOUND OF PELVIS TECHNIQUE: Limited transabdominal ultrasound examination of the pelvis was performed. COMPARISON:  Abdominopelvic CT 04/06/2017. FINDINGS: Ultrasound of the left lower quadrant demonstrates normal peristalsing bowel. No hernia or focal abnormality identified. IMPRESSION: No left lower quadrant abdominal abnormality identified. If persistent concern of hernia, consider CT for further evaluation. Findings were discussed by telephone with Dr. Lynelle DoctorKnapp. Electronically Signed   By: Carey BullocksWilliam  Veazey M.D.   On: 07/12/2018 19:43    Procedures Procedures (including critical care time)  Medications Ordered in ED Medications  ketorolac (TORADOL) 30 MG/ML injection 30 mg (30 mg Intravenous Given 07/12/18 1856)     Initial Impression / Assessment and Plan / ED Course  I have reviewed the triage vital signs and the nursing notes.  Pertinent labs & imaging results that were available during my care of the patient were reviewed by me and considered in my medical decision making (see chart for details).   Patient presented to the emergency room for evaluation of chronic lower abdominal pain.  Patient's laboratory tests are normal.  Patient was adamant about wanting an ultrasound of her abdomen since that showed her hernia previously.  Splinted patient that a CT scan would be a better study.   the ultrasound does not show any acute abnormality.  Patient did have a CT scan this month at a [facility] medical facility.  Those results were available in care everywhere.  No acute findings were noted at that time.  Patient appears stable for discharge.  There is no evidence of acute infection, obstruction or GI bleeding here tonight.  At this time there does not appear to be any evidence of an acute emergency medical condition and the patient appears stable for discharge with appropriate outpatient follow up.   Final Clinical Impressions(s) / ED Diagnoses    Final diagnoses:  Abdominal pain, unspecified abdominal location    ED Discharge Orders    None       Linwood DibblesKnapp, Valor [place], MD 07/12/18 2014

## 2018-08-31 ENCOUNTER — Ambulatory Visit: Payer: BLUE CROSS/BLUE SHIELD | Admitting: Pulmonary Disease

## 2018-09-07 ENCOUNTER — Other Ambulatory Visit: Payer: Self-pay | Admitting: Gastroenterology

## 2018-09-07 DIAGNOSIS — Z9889 Other specified postprocedural states: Secondary | ICD-10-CM

## 2018-09-07 DIAGNOSIS — K921 Melena: Secondary | ICD-10-CM

## 2019-01-28 ENCOUNTER — Other Ambulatory Visit: Payer: Self-pay

## 2019-01-28 ENCOUNTER — Emergency Department (HOSPITAL_BASED_OUTPATIENT_CLINIC_OR_DEPARTMENT_OTHER): Payer: BC Managed Care – PPO

## 2019-01-28 ENCOUNTER — Emergency Department (HOSPITAL_BASED_OUTPATIENT_CLINIC_OR_DEPARTMENT_OTHER)
Admission: EM | Admit: 2019-01-28 | Discharge: 2019-01-28 | Disposition: A | Discharge status: Home / Self Care | Payer: BC Managed Care – PPO | Attending: Emergency Medicine | Admitting: Emergency Medicine

## 2019-01-28 ENCOUNTER — Encounter (HOSPITAL_BASED_OUTPATIENT_CLINIC_OR_DEPARTMENT_OTHER): Payer: Self-pay | Admitting: Emergency Medicine

## 2019-01-28 DIAGNOSIS — M5412 Radiculopathy, cervical region: Secondary | ICD-10-CM | POA: Insufficient documentation

## 2019-01-28 DIAGNOSIS — J449 Chronic obstructive pulmonary disease, unspecified: Secondary | ICD-10-CM | POA: Insufficient documentation

## 2019-01-28 DIAGNOSIS — R51 Headache: Secondary | ICD-10-CM | POA: Insufficient documentation

## 2019-01-28 DIAGNOSIS — F17210 Nicotine dependence, cigarettes, uncomplicated: Secondary | ICD-10-CM | POA: Insufficient documentation

## 2019-01-28 DIAGNOSIS — Z20828 Contact with and (suspected) exposure to other viral communicable diseases: Secondary | ICD-10-CM | POA: Insufficient documentation

## 2019-01-28 DIAGNOSIS — Z79899 Other long term (current) drug therapy: Secondary | ICD-10-CM | POA: Diagnosis not present

## 2019-01-28 DIAGNOSIS — R531 Weakness: Secondary | ICD-10-CM | POA: Diagnosis present

## 2019-01-28 LAB — URINALYSIS, ROUTINE W REFLEX MICROSCOPIC
Bilirubin Urine: NEGATIVE
Glucose, UA: NEGATIVE mg/dL
Hgb urine dipstick: NEGATIVE
Ketones, ur: NEGATIVE mg/dL
Leukocytes,Ua: NEGATIVE
Nitrite: NEGATIVE
Protein, ur: NEGATIVE mg/dL
Specific Gravity, Urine: <1.005 — ABNORMAL LOW (ref 1.005–1.030)
pH: 7 (ref 5.0–8.0)

## 2019-01-28 LAB — CBC WITH DIFFERENTIAL/PLATELET
Abs Immature Granulocytes: 0.01 10*3/uL (ref 0.00–0.07)
Basophils Absolute: 0 10*3/uL (ref 0.0–0.1)
Basophils Relative: 1 %
Eosinophils Absolute: 0.2 10*3/uL (ref 0.0–0.5)
Eosinophils Relative: 3 %
HCT: 43.5 % (ref 36.0–46.0)
Hemoglobin: 14.1 g/dL (ref 12.0–15.0)
Immature Granulocytes: 0 %
Lymphocytes Relative: 39 %
Lymphs Abs: 2.4 10*3/uL (ref 0.7–4.0)
MCH: 32 pg (ref 26.0–34.0)
MCHC: 32.4 g/dL (ref 30.0–36.0)
MCV: 98.6 fL (ref 80.0–100.0)
Monocytes Absolute: 0.4 10*3/uL (ref 0.1–1.0)
Monocytes Relative: 6 %
Neutro Abs: 3.1 10*3/uL (ref 1.7–7.7)
Neutrophils Relative %: 51 %
Platelets: 195 10*3/uL (ref 150–400)
RBC: 4.41 MIL/uL (ref 3.87–5.11)
RDW: 11.8 % (ref 11.5–15.5)
WBC: 6 10*3/uL (ref 4.0–10.5)
nRBC: 0 % (ref 0.0–0.2)

## 2019-01-28 LAB — COMPREHENSIVE METABOLIC PANEL
ALT: 20 U/L (ref 0–44)
AST: 22 U/L (ref 15–41)
Albumin: 4.3 g/dL (ref 3.5–5.0)
Alkaline Phosphatase: 76 U/L (ref 38–126)
Anion gap: 7 (ref 5–15)
BUN: 12 mg/dL (ref 6–20)
CO2: 28 mmol/L (ref 22–32)
Calcium: 9.5 mg/dL (ref 8.9–10.3)
Chloride: 106 mmol/L (ref 98–111)
Creatinine, Ser: 0.68 mg/dL (ref 0.44–1.00)
GFR calc Af Amer: >60 mL/min (ref >60)
GFR calc non Af Amer: >60 mL/min (ref >60)
Glucose, Bld: 93 mg/dL (ref 70–99)
Potassium: 4.4 mmol/L (ref 3.5–5.1)
Sodium: 141 mmol/L (ref 135–145)
Total Bilirubin: 0.8 mg/dL (ref 0.3–1.2)
Total Protein: 7.3 g/dL (ref 6.5–8.1)

## 2019-01-28 LAB — TROPONIN I: Troponin I: <0.03 ng/mL (ref <0.03)

## 2019-01-28 MED ORDER — ONDANSETRON HCL 4 MG/2ML IJ SOLN
4.0000 mg | Freq: Once | INTRAMUSCULAR | Status: AC
  Administered 2019-01-28: 4 mg via INTRAVENOUS
  Filled 2019-01-28: qty 2

## 2019-01-28 MED ORDER — SODIUM CHLORIDE 0.9 % IV BOLUS
1000.0000 mL | Freq: Once | INTRAVENOUS | Status: AC
  Administered 2019-01-28: 1000 mL via INTRAVENOUS

## 2019-01-28 MED ORDER — METHOCARBAMOL 500 MG PO TABS: 500.0000 mg | ORAL_TABLET | Freq: Two times a day (BID) | ORAL | 0 refills | Status: DC

## 2019-01-28 NOTE — ED Provider Notes (Signed)
[facility]ERGENC[facility] DEPARTMENT Provider Note   CSN: [id number] Arrival date & time: 01/28/19  1317    History   Chief Complaint Chief Complaint  Patient presents with   Weakness    HPI Makayla Reid is a 48 y.o. female with PMH/o COPD, GERD, who presents for evaluation of 3 weeks of intermittent generalized weakness, lightheadedness, fatigue, headache, pain in left shoulder and chest. Patient reports that she started noticing some pain in the posterior aspect of her left scapular region that radiated to her neck, down her arm and over to her chest. Patient states that this pain would come randomly and was not associated with any particular action or exertion. The pain would feel like a "sore and ache" and was worse with movement of the LUE. She states that at times, she would have trouble moving her left shoulder secondary to pain. She did not have any overlying warmth, erythema. She states the pain was not worse with deep inspiration. She also reports that she would occasionally feel like she is wheezing. She also reports cough and states that it is productive of phlegm. No hemoptysis. Patient states she has had some active fever/chills but has not measured a temperature.  Patient states that she feels like she has no energy and is run down.  She has not noticed any rash or has known any tick bites.  Patient states she is a current smoker and smokes out 4 cigarettes a day.  She denies any history of cardiac issues, family history of MIs before the age of 1, personal history of high blood pressure or diabetes. She denies any OCP use, recent immobilization, prior history of DVT/PE, recent surgery, leg swelling, or long travel.  She denies any vomiting, abdominal pain, numbness/weakness of arms or legs, vision changes.     The history is provided by the patient.    Past Medical History:  Diagnosis Date   Anxiety    Bilateral carpal tunnel syndrome 11/22/8117   Complication  of anesthesia    Severe migraine headache   COPD (chronic obstructive pulmonary disease) (HCC)    Depression    GERD (gastroesophageal reflux disease)    History of   History of hiatal hernia    Kidney infection    Reinke's edema of vocal folds    Seasonal allergies    Tobacco abuse disorder 07/13/2016   Upper respiratory infection 07/13/2016    Patient Active Problem List   Diagnosis Date Noted   Chest pain, pleuritic 06/02/2018   Carpal tunnel syndrome on right 10/07/2017   Bilateral carpal tunnel syndrome 09/12/2017   Perennial and seasonal allergic rhinitis 11/09/2016   Moderate persistent asthma 11/09/2016   Allergic conjunctivitis 11/09/2016   Dermatitis 11/09/2016   Tobacco abuse disorder 07/13/2016   Depression 03/31/2016   Chronic obstructive pulmonary disease ([place]) 03/31/2016   HA (headache) 05/19/2013    Past Surgical History:  Procedure Laterality Date   ABDOMINAL HYSTERECTOMY     CARPAL TUNNEL RELEASE Right 10/07/2017   Procedure: CARPAL TUNNEL RELEASE;  Surgeon: Dorna Leitz, MD;  Location: [facility];  Service: Orthopedics;  Laterality: Right;   CHOLECYSTECTOMY     DIAGNOSTIC LAPAROSCOPY  09/16/1999   Dr. Ulyses Southward, biopsy of left uterosacral ligament and left uterosacral nerve ablation   MICROLARYNGOSCOPY     TONSILLECTOMY     TUBAL LIGATION       OB History   No obstetric history on file.      Home  Medications    Prior to Admission medications   Medication Sig Start Date End Date Taking? Authorizing Provider  acetaminophen (TYLENOL) 500 MG tablet Take 500 mg by mouth every 6 (six) hours as needed for moderate pain.    [provider]  albuterol (PROAIR HFA) 108 (90 Base) MCG/ACT inhaler Inhale 1-2 puffs into the lungs every 6 (six) hours as needed for wheezing or shortness of breath.    [provider]  ALPRAZolam Prudy Feeler(XANAX) 1 MG tablet Take 1 tablet (1 mg total) by mouth 3 (three)  times daily as needed for anxiety or sleep. Office visit needed Patient taking differently: Take 1 mg by mouth 4 (four) times daily. Office visit needed 07/19/16   Copland, Gwenlyn FoundJessica C, MD  cetirizine (ZYRTEC) 10 MG tablet Take 10 mg by mouth daily.    [provider]  citalopram (CELEXA) 10 MG tablet Take 10 mg by mouth daily.  07/04/18   [provider]  EPINEPHrine (EPIPEN 2-PAK) 0.3 mg/0.3 mL IJ SOAJ injection Inject 0.3 mLs (0.3 mg total) into the muscle once. 11/11/16 11/11/16  Bobbitt, Heywood Ilesalph Carter, MD  ibuprofen (ADVIL,MOTRIN) 800 MG tablet Take 1 tablet (800 mg total) by mouth every 8 (eight) hours as needed. Patient not taking: Reported on 07/12/2018 06/12/18   Glenford BayleyWalsh, Elizabeth W, NP  lidocaine (XYLOCAINE) 2 % solution Use as directed 15 mLs in the mouth or throat every 6 (six) hours as needed for mouth pain.  07/06/18   [provider]  methocarbamol (ROBAXIN) 500 MG tablet Take 1 tablet (500 mg total) by mouth 2 (two) times daily. 01/28/19   Maxwell CaulLayden, Namrata Dangler A, PA-C  nystatin (MYCOSTATIN) 100000 UNIT/ML suspension Take 5 mLs by mouth 3 (three) times daily.  07/06/18   [provider]  Omega-3 1000 MG CAPS Take 1 g by mouth daily.     [provider]  Prenatal Vit-Fe Fumarate-FA (MULTIVITAMIN-PRENATAL) 27-0.8 MG TABS tablet Take 1 tablet by mouth daily at 12 noon.    [provider]  SYMBICORT 160-4.5 MCG/ACT inhaler Inhale 2 puffs into the lungs 2 (two) times daily. 05/18/18   [provider]  Tiotropium Bromide Monohydrate (SPIRIVA RESPIMAT) 2.5 MCG/ACT AERS Inhale 2 puffs into the lungs daily for 1 day. 06/02/18 06/03/18  Luciano CutterEllison, Chi Jane, MD  Tiotropium Bromide Monohydrate (SPIRIVA RESPIMAT) 2.5 MCG/ACT AERS Inhale 2 puffs into the lungs daily. 06/02/18   Luciano CutterEllison, Chi Jane, MD    Family History Family History  Problem Relation Age of Onset   Allergic rhinitis Mother    Kidney disease Mother    Allergic rhinitis Father      Allergic rhinitis Brother    Kidney disease Maternal Uncle    Angioedema Neg Hx    Asthma Neg Hx    Atopy Neg Hx    Eczema Neg Hx    Immunodeficiency Neg Hx    Urticaria Neg Hx     Social History Social History   Tobacco Use   Smoking status: Current Every Day Smoker    Packs/day: 0.25    Years: 33.00    Pack years: 8.25    Types: Cigarettes   Smokeless tobacco: Never Used   Tobacco comment: one cigarette daily as of Oct 2019  Substance Use Topics   Alcohol use: Yes    Comment: socially   Drug use: No     Allergies   Milk-related compounds and Penicillins   Review of Systems Review of Systems  Constitutional: Positive for activity change, chills  and fatigue. Negative for fever.  Respiratory: Positive for cough and wheezing. Negative for shortness of breath.   Cardiovascular: Negative for chest pain.  Gastrointestinal: Positive for nausea. Negative for abdominal pain and vomiting.  Genitourinary: Negative for dysuria and hematuria.  Musculoskeletal:       Shoulder pain  Neurological: Positive for weakness and headaches.  All other systems reviewed and are negative.    Physical Exam Updated Vital Signs BP (!) 144/87    Pulse (!) 52    Temp 98.8 F (37.1 C) (Oral)    Resp 15    Ht  (1.6 m)    Wt 46.3 kg    LMP 04/02/2014 Comment: tubal ligation   SpO2 100%    BMI 18.07 kg/m   Physical Exam Vitals signs and nursing note reviewed.  Constitutional:      Appearance: Normal appearance. She is well-developed.  HENT:     Head: Normocephalic and atraumatic.  Eyes:     General: Lids are normal.     Conjunctiva/sclera: Conjunctivae normal.     Pupils: Pupils are equal, round, and reactive to light.     Comments: PERRL. EOMs.   Neck:     Musculoskeletal: Full passive range of motion without pain.      Comments: Full flexion/extension and lateral movement of neck fully intact. No bony midline tenderness. No deformities or crepitus. Positive  Spurling's maneuver Cardiovascular:     Rate and Rhythm: Normal rate and regular rhythm.     Pulses: Normal pulses.          Radial pulses are 2+ on the right side and 2+ on the left side.       Dorsalis pedis pulses are 2+ on the right side and 2+ on the left side.     Heart sounds: Normal heart sounds. No murmur. No friction rub. No gallop.   Pulmonary:     Effort: Pulmonary effort is normal.     Breath sounds: Normal breath sounds.     Comments: Lungs clear to auscultation bilaterally.  Symmetric chest rise.  No wheezing, rales, rhonchi. No evidence of respiratory distress.  Abdominal:     Palpations: Abdomen is soft. Abdomen is not rigid.     Tenderness: There is no abdominal tenderness. There is no guarding.     Comments: Abdomen is soft, non-distended, non-tender. No rigidity, No guarding. No peritoneal signs.  Musculoskeletal: Normal range of motion.       Arms:     Comments: No midline T or L spine tenderness. Diffuse tenderness noted to scapular region that extends over the left shoulder. No overlying warmth, erythema. Limited ROM secondary of pain. No tenderness to palpation noted to the RUE.   Skin:    General: Skin is warm and dry.     Capillary Refill: Capillary refill takes less than 2 seconds.  Neurological:     Mental Status: She is alert and oriented to person, place, and time.     Comments: Cranial nerves III-XII intact Follows commands, Moves all extremities  5/5 strength to BUE and BLE  Equal grip strength  Sensation intact throughout all major nerve distributions Normal cordination. No pronator drift. No gait abnormalities  No slurred speech. No facial droop.   Psychiatric:        Speech: Speech normal.      ED Treatments / Results  Labs (all labs ordered are listed, but only abnormal results are displayed) Labs Reviewed  URINALYSIS, ROUTINE W REFLEX MICROSCOPIC -  Abnormal; Notable for the following components:      Result Value   Color, Urine STRAW  (*)    Specific Gravity, Urine <1.005 (*)    All other components within normal limits  NOVEL CORONAVIRUS, NAA (HOSPITAL ORDER, SEND-OUT TO REF LAB)  COMPREHENSIVE METABOLIC PANEL  CBC WITH DIFFERENTIAL/PLATELET  TROPONIN I    EKG EKG Interpretation  Date/Time:  Sunday January 28 2019 13:29:46 EDT Ventricular Rate:  68 PR Interval:    QRS Duration: 84 QT Interval:  388 QTC Calculation: 413 R Axis:   94 Text Interpretation:  Sinus rhythm Borderline short PR interval Anterior infarct, old Baseline wander in lead(s) V4 V5 V6 No significant change since last tracing Confirmed by Plunkett, Whitney ([id number]) on 01/28/2019 2:27:04 PM   Radiology Dg Chest 2 View  Result Date: 01/28/2019 CLINICAL DATA:  Generalized malaise and left-sided muscle pain for 3 weeks. Weakness. EXAM: CHEST - 2 VIEW COMPARISON:  01/20/2018. FINDINGS: The cardiac silhouette is normal in size and configuration. No mediastinal or hilar masses. There is no evidence of adenopathy. Lungs are hyperexpanded, but clear. No pleural effusion or pneumothorax. Skeletal structures are unremarkable. IMPRESSION: 1. No acute cardiopulmonary disease. 2. Hyperexpanded lungs consistent with COPD. Electronically Signed   By: David  Ormond M.D.   On: 01/28/2019 15:17   Ct Head Wo Contrast  Result Date: 01/28/2019 CLINICAL DATA:  Pt reports general malaise and LT side (of body) muscle pain for 3 wks EXAM: CT HEAD WITHOUT CONTRAST TECHNIQUE: Contiguous axial images were obtained from the base of the skull through the vertex without intravenous contrast. COMPARISON:  None. FINDINGS: Brain: No evidence of acute infarction, hemorrhage, hydrocephalus, extra-axial collection or mass lesion/mass effect. Vascular: No hyperdense vessel or unexpected calcification. Skull: Normal. Negative for fracture or focal lesion. Sinuses/Orbits: Normal globes and orbits. Mild ethmoid sinus mucosal thickening. Clear mastoid air cells. Other: None. IMPRESSION: 1. No  intracranial abnormality. 2. Mild ethmoid sinus mucosal thickening.  No other abnormality. Electronically Signed   By: David  Ormond M.D.   On: 01/28/2019 15:22    Procedures Procedures (including critical care time)  Medications Ordered in ED Medications  sodium chloride 0.9 % bolus 1,000 mL ( Intravenous Stopped 01/28/19 1532)  ondansetron (ZOFRAN) injection 4 mg (4 mg Intravenous Given 01/28/19 1427)     Initial Impression / Assessment and Plan / ED Course  I have reviewed the triage vital signs and the nursing notes.  Pertinent labs & imaging results that were available during my care of the patient were reviewed by me and considered in my medical decision making (see chart for details).        48  year old female who presents for evaluation of constellation of symptoms that have been ongoing for last 3 weeks.  She reports generalized weakness, fatigue, cough.  She reports occasionally having some wheezing.  Additionally, she reports some pain to her left shoulder that radiates up into her neck and down to her arm.  She reports some subjective fever chills but has not measured any temperature.  Patient also reports some intermittent headaches.  She states that she does not get headaches frequently.  She also reports some nausea but denies any vomiting.  Patient with no dizziness, blurry vision, vomiting, numbness/weakness of her arms or legs. Patient is afebrile, non-toxic appearing, sitting comfortably on examination table. Vital signs reviewed and stable.  No neuro deficits noted on exam.  Lungs clear to auscultation without any signs of wheezing.  No evidence of respiratory distress.  Her left shoulder pain is more focused over the posterior aspect of the left scapula that extends over the left shoulder.  She reports pain that radiates from the neck into the left arm.  She does have positive Spurling's maneuver.  SPECT that this is most likely caused from cervical radiculopathy.  At this  time, her pain does not sound cardiac in nature.  Additionally, patient with no signs of neuro deficits that would be concerning for CVA, increased intracranial hemorrhage though given her age and the fact that she does not have frequent headaches, will plan for CT head for evaluation.  Plan for labs.  CMP is unremarkable. Trop is negative. UA negative for infectious etiology. CBC negative for any leukocytosis or anemia. CT scan unremarkable. Negative for any acute intracranial abnormality. CXR negative for any acute infectious process.   At this time, vitals are stable.  Work-up is reassuring.  Given that she has a negative troponin with reassuring EKG, given that her symptoms are not concerning for ACS etiology, do not feel that she needs a second troponin.  Given her constellation of symptoms, will plan for outpatient COVID-19 test.  At this time, she is not hypoxic and vitals are stable.  She does not require admission.  Discussed results with patient.  We will plan to treat her pain is cervical radiculopathy.  Encouraged at home supportive care measures.  Instructed patient to self quarantine until her COVID test comes back.  Patient expresses understanding. Discussed patient with Dr. Criss AlvineGoldston who is agreeable to plan. At this time, patient exhibits no emergent life-threatening condition that require further evaluation in ED or admission. Patient had ample opportunity for questions and discussion. All patient's questions were answered with full understanding. Strict return precautions discussed. Patient expresses understanding and agreement to plan.   Makayla Reid was evaluated in Emergency Department on 01/28/2019 for the symptoms described in the history of present illness. She was evaluated in the context of the global COVID-19 pandemic, which necessitated consideration that the patient might be at risk for infection with the SARS-CoV-2 virus that causes COVID-19. Institutional protocols and  algorithms that pertain to the evaluation of patients at risk for COVID-19 are in a state of rapid change based on information released by regulatory bodies including the CDC and federal and state organizations. These policies and algorithms were followed during the patient's care in the ED.  Portions of this note were generated with Scientist, clinical (histocompatibility and immunogenetics)Dragon dictation software. Dictation errors may occur despite best attempts at proofreading.  Final Clinical Impressions(s) / ED Diagnoses   Final diagnoses:  Cervical radiculopathy  Generalized weakness    ED Discharge Orders         Ordered    methocarbamol (ROBAXIN) 500 MG tablet  2 times daily     01/28/19 1548           Rosana HoesLayden, Bobetta Korf A, PA-C 01/28/19 2117    Pricilla LovelessGoldston, Scott, MD 01/28/19 2257

## 2019-01-28 NOTE — ED Notes (Signed)
Pt reports general malaise and LT side (of body) muscle pain for 3 wks

## 2019-01-28 NOTE — Discharge Instructions (Signed)
You can take Tylenol or Ibuprofen as directed for pain. You can alternate Tylenol and Ibuprofen every 4 hours. If you take Tylenol at 1pm, then you can take Ibuprofen at 5pm. Then you can take Tylenol again at 9pm.   Take Robaxin as prescribed. This medication will make you drowsy so do not drive or drink alcohol when taking it.  Apply warm compresses to shoulder to help with pain.  As we discussed, you have been tested for COVID-19.  Results may take a few days to come back.  You should self quarantine yourself until results are back.  Return emergency department for any chest pain, difficulty breathing, any other worsening or concerning symptoms.      Person Under Monitoring Name: Makayla Reid  [place] [address] [place] [facility] [id number]   Infection Prevention Recommendations for Individuals Confirmed to have, or Being Evaluated for, 2019 Novel Coronavirus (COVID-19) Infection Who Receive Care at Home  Individuals who are confirmed to have, or are being evaluated for, COVID-19 should follow the prevention steps below until a healthcare provider or local or state health department says they can return to normal activities.  Stay home except to get medical care You should restrict activities outside your home, except for getting medical care. Do not go to work, school, or public areas, and do not use public transportation or taxis.  Call ahead before visiting your doctor Before your medical appointment, call the healthcare provider and tell them that you have, or are being evaluated for, COVID-19 infection. This will help the healthcare providers office take steps to keep other people from getting infected. Ask your healthcare provider to call the local or state health department.  Monitor your symptoms Seek prompt medical attention if your illness is worsening (e.g., difficulty breathing). Before going to your medical appointment, call the healthcare provider and tell them  that you have, or are being evaluated for, COVID-19 infection. Ask your healthcare provider to call the local or state health department.  Wear a facemask You should wear a facemask that covers your nose and mouth when you are in the same room with other people and when you visit a healthcare provider. People who live with or visit you should also wear a facemask while they are in the same room with you.  Separate yourself from other people in your home As much as possible, you should stay in a different room from other people in your home. Also, you should use a separate bathroom, if available.  Avoid sharing household items You should not share dishes, drinking glasses, cups, eating utensils, towels, bedding, or other items with other people in your home. After using these items, you should wash them thoroughly with soap and water.  Cover your coughs and sneezes Cover your mouth and nose with a tissue when you cough or sneeze, or you can cough or sneeze into your sleeve. Throw used tissues in a lined trash can, and immediately wash your hands with soap and water for at least 20 seconds or use an alcohol-based hand rub.  Wash your [employer] Corporationhands Wash your hands often and thoroughly with soap and water for at least 20 seconds. You can use an alcohol-based hand sanitizer if soap and water are not available and if your hands are not visibly dirty. Avoid touching your eyes, nose, and mouth with unwashed hands.   Prevention Steps for Caregivers and Household Members of Individuals Confirmed to have, or Being Evaluated for, COVID-19 Infection Being Cared for  in the Home  If you live with, or provide care at home for, a person confirmed to have, or being evaluated for, COVID-19 infection please follow these guidelines to prevent infection:  Follow healthcare providers instructions Make sure that you understand and can help the patient follow any healthcare provider instructions for all  care.  Provide for the patients basic needs You should help the patient with basic needs in the home and provide support for getting groceries, prescriptions, and other personal needs.  Monitor the patients symptoms If they are getting sicker, call his or her medical provider and tell them that the patient has, or is being evaluated for, COVID-19 infection. This will help the healthcare providers office take steps to keep other people from getting infected. Ask the healthcare provider to call the local or state health department.  Limit the number of people who have contact with the patient  If possible, have only one caregiver for the patient.  Other household members should stay in another home or place of residence. If this is not possible, they should stay  in another room, or be separated from the patient as much as possible. Use a separate bathroom, if available.  Restrict visitors who do not have an essential need to be in the home.  Keep older adults, very young children, and other sick people away from the patient Keep older adults, very young children, and those who have compromised immune systems or chronic health conditions away from the patient. This includes people with chronic heart, lung, or kidney conditions, diabetes, and cancer.  Ensure good ventilation Make sure that shared spaces in the home have good air flow, such as from an air conditioner or an opened window, weather permitting.  Wash your hands often  Wash your hands often and thoroughly with soap and water for at least 20 seconds. You can use an alcohol based hand sanitizer if soap and water are not available and if your hands are not visibly dirty.  Avoid touching your eyes, nose, and mouth with unwashed hands.  Use disposable paper towels to dry your hands. If not available, use dedicated cloth towels and replace them when they become wet.  Wear a facemask and gloves  Wear a disposable facemask at  all times in the room and gloves when you touch or have contact with the patients blood, body fluids, and/or secretions or excretions, such as sweat, saliva, sputum, nasal mucus, vomit, urine, or feces.  Ensure the mask fits over your nose and mouth tightly, and do not touch it during use.  Throw out disposable facemasks and gloves after using them. Do not reuse.  Wash your hands immediately after removing your facemask and gloves.  If your personal clothing becomes contaminated, carefully remove clothing and launder. Wash your hands after handling contaminated clothing.  Place all used disposable facemasks, gloves, and other waste in a lined container before disposing them with other household waste.  Remove gloves and wash your hands immediately after handling these items.  Do not share dishes, glasses, or other household items with the patient  Avoid sharing household items. You should not share dishes, drinking glasses, cups, eating utensils, towels, bedding, or other items with a patient who is confirmed to have, or being evaluated for, COVID-19 infection.  After the person uses these items, you should wash them thoroughly with soap and water.  Wash laundry thoroughly  Immediately remove and wash clothes or bedding that have blood, body fluids, and/or secretions or  excretions, such as sweat, saliva, sputum, nasal mucus, vomit, urine, or feces, on them.  Wear gloves when handling laundry from the patient.  Read and follow directions on labels of laundry or clothing items and detergent. In general, wash and dry with the warmest temperatures recommended on the label.  Clean all areas the individual has used often  Clean all touchable surfaces, such as counters, tabletops, doorknobs, bathroom fixtures, toilets, phones, keyboards, tablets, and bedside tables, every day. Also, clean any surfaces that may have blood, body fluids, and/or secretions or excretions on them.  Wear gloves when  cleaning surfaces the patient has come in contact with.  Use a diluted bleach solution (e.g., dilute bleach with 1 part bleach and 10 parts water) or a household disinfectant with a label that says EPA-registered for coronaviruses. To make a bleach solution at home, add 1 tablespoon of bleach to 1 quart (4 cups) of water. For a larger supply, add  cup of bleach to 1 gallon (16 cups) of water.  Read labels of cleaning products and follow recommendations provided on product labels. Labels contain instructions for safe and effective use of the cleaning product including precautions you should take when applying the product, such as wearing gloves or eye protection and making sure you have good ventilation during use of the product.  Remove gloves and wash hands immediately after cleaning.  Monitor yourself for signs and symptoms of illness Caregivers and household members are considered close contacts, should monitor their health, and will be asked to limit movement outside of the home to the extent possible. Follow the monitoring steps for close contacts listed on the symptom monitoring form.   ? If you have additional questions, contact your local health department or call the epidemiologist on call at [phone] (available 24/7). ? This guidance is subject to change. For the most up-to-date guidance from [facility], please refer to their website: TripMetro.huhttps://www.cdc.gov/coronavirus/2019-ncov/hcp/guidance-prevent-spread.html

## 2019-01-28 NOTE — ED Notes (Signed)
Patient transported to CT 

## 2019-01-28 NOTE — ED Triage Notes (Signed)
Generalized weakness, chest pain, neck pain , sore throat x 3 weeks.

## 2019-01-30 LAB — NOVEL CORONAVIRUS, NAA (HOSP ORDER, SEND-OUT TO REF LAB; TAT 18-24 HRS): SARS-CoV-2, NAA: NOT DETECTED

## 2019-04-28 ENCOUNTER — Emergency Department (HOSPITAL_BASED_OUTPATIENT_CLINIC_OR_DEPARTMENT_OTHER): Payer: BC Managed Care – PPO

## 2019-04-28 ENCOUNTER — Other Ambulatory Visit: Payer: Self-pay

## 2019-04-28 ENCOUNTER — Emergency Department (HOSPITAL_BASED_OUTPATIENT_CLINIC_OR_DEPARTMENT_OTHER)
Admission: EM | Admit: 2019-04-28 | Discharge: 2019-04-28 | Disposition: A | Discharge status: Home / Self Care | Payer: BC Managed Care – PPO | Attending: Emergency Medicine | Admitting: Emergency Medicine

## 2019-04-28 ENCOUNTER — Encounter (HOSPITAL_BASED_OUTPATIENT_CLINIC_OR_DEPARTMENT_OTHER): Payer: Self-pay | Admitting: Emergency Medicine

## 2019-04-28 DIAGNOSIS — Z23 Encounter for immunization: Secondary | ICD-10-CM | POA: Diagnosis not present

## 2019-04-28 DIAGNOSIS — F17210 Nicotine dependence, cigarettes, uncomplicated: Secondary | ICD-10-CM | POA: Insufficient documentation

## 2019-04-28 DIAGNOSIS — M79674 Pain in right toe(s): Secondary | ICD-10-CM | POA: Diagnosis not present

## 2019-04-28 DIAGNOSIS — J449 Chronic obstructive pulmonary disease, unspecified: Secondary | ICD-10-CM | POA: Diagnosis not present

## 2019-04-28 MED ORDER — DOXYCYCLINE HYCLATE 100 MG PO CAPS: 100.0000 mg | ORAL_CAPSULE | Freq: Two times a day (BID) | ORAL | 0 refills | Status: AC

## 2019-04-28 MED ORDER — TETANUS-DIPHTH-ACELL PERTUSSIS 5-2.5-18.5 LF-MCG/0.5 IM SUSP
0.5000 mL | Freq: Once | INTRAMUSCULAR | Status: AC
  Administered 2019-04-28: 0.5 mL via INTRAMUSCULAR
  Filled 2019-04-28: qty 0.5

## 2019-04-28 NOTE — ED Provider Notes (Signed)
[facility] EMERGENCY DEPARTMENT Provider Note   CSN: [id number] Arrival date & time: 04/28/19  1833     History   Chief Complaint Chief Complaint  Patient presents with  . Toe Injury    HPI Makayla Reid is a 48 y.o. female with a past medical history of COPD, depression, tobacco abuse disorder, who presents today for evaluation of right great toe pain.  She reports that 6 days ago she was kicking a door shut while wearing sandals and had sudden onset of pain in her big toe.  She denies any wounds.  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  She denies any injuries other than her great toe.  She reports that she has tried ice and elevation which will provide temporary relief.  No fevers.     HPI  Past Medical History:  Diagnosis Date  . Anxiety   . Bilateral carpal tunnel syndrome 09/12/2017  . Complication of anesthesia    Severe migraine headache  . COPD (chronic obstructive pulmonary disease) ([place])   . Depression   . GERD (gastroesophageal reflux disease)    History of  . History of hiatal hernia   . Kidney infection   . Reinke's edema of vocal folds   . Seasonal allergies   . Tobacco abuse disorder 07/13/2016  . Upper respiratory infection 07/13/2016    Patient Active Problem List   Diagnosis Date Noted  . Chest pain, pleuritic 06/02/2018  . Carpal tunnel syndrome on right 10/07/2017  . Bilateral carpal tunnel syndrome 09/12/2017  . Perennial and seasonal allergic rhinitis 11/09/2016  . Moderate persistent asthma 11/09/2016  . Allergic conjunctivitis 11/09/2016  . Dermatitis 11/09/2016  . Tobacco abuse disorder 07/13/2016  . Depression 03/31/2016  . Chronic obstructive pulmonary disease ([place]) 03/31/2016  . HA (headache) 05/19/2013    Past Surgical History:  Procedure Laterality Date  . ABDOMINAL HYSTERECTOMY    . CARPAL TUNNEL RELEASE Right 10/07/2017   Procedure: CARPAL TUNNEL RELEASE;  Surgeon: Dorna Leitz, MD;  Location: [facility];  Service: Orthopedics;  Laterality: Right;  . CHOLECYSTECTOMY    . DIAGNOSTIC LAPAROSCOPY  09/16/1999   Dr. Ulyses Southward, biopsy of left uterosacral ligament and left uterosacral nerve ablation  . HERNIA REPAIR    . MICROLARYNGOSCOPY    . TONSILLECTOMY    . TUBAL LIGATION       OB History   No obstetric history on file.      Home Medications    Prior to Admission medications   Medication Sig Start Date End Date Taking? Authorizing Provider  acetaminophen (TYLENOL) 500 MG tablet Take 500 mg by mouth every 6 (six) hours as needed for moderate pain.    [provider]  albuterol (PROAIR HFA) 108 (90 Base) MCG/ACT inhaler Inhale 1-2 puffs into the lungs every 6 (six) hours as needed for wheezing or shortness of breath.    [provider]  ALPRAZolam Duanne Moron) 1 MG tablet Take 1 tablet (1 mg total) by mouth 3 (three) times daily as needed for anxiety or sleep. Office visit needed Patient taking differently: Take 1 mg by mouth 4 (four) times daily. Office visit needed 07/19/16   Copland, Gay Filler, MD  cetirizine (ZYRTEC) 10 MG tablet Take 10 mg by mouth daily.    [provider]  citalopram (CELEXA) 10 MG tablet Take 10 mg by mouth daily.  07/04/18   [provider]  doxycycline (  VIBRAMYCIN) 100 MG capsule Take 1 capsule (100 mg total) by mouth 2 (two) times daily for 7 days. 04/28/19 05/05/19  Cristina Gong, PA-C  EPINEPHrine (EPIPEN 2-PAK) 0.3 mg/0.3 mL IJ SOAJ injection Inject 0.3 mLs (0.3 mg total) into the muscle once. 11/11/16 11/11/16  Bobbitt, Heywood Iles, MD  ibuprofen (ADVIL,MOTRIN) 800 MG tablet Take 1 tablet (800 mg total) by mouth every 8 (eight) hours as needed. Patient not taking: Reported on 07/12/2018 06/12/18   Glenford Bayley, NP  lidocaine (XYLOCAINE) 2 % solution Use as directed 15 mLs in the mouth or throat every 6 (six) hours as needed for mouth pain.  07/06/18   [provider]  methocarbamol (ROBAXIN) 500 MG tablet Take 1 tablet (500 mg total) by mouth 2 (two) times daily. 01/28/19   Maxwell Caul, PA-C  nystatin (MYCOSTATIN) 100000 UNIT/ML suspension Take 5 mLs by mouth 3 (three) times daily.  07/06/18   [provider]  Omega-3 1000 MG CAPS Take 1 g by mouth daily.     [provider]  Prenatal Vit-Fe Fumarate-FA (MULTIVITAMIN-PRENATAL) 27-0.8 MG TABS tablet Take 1 tablet by mouth daily at 12 noon.    [provider]  SYMBICORT 160-4.5 MCG/ACT inhaler Inhale 2 puffs into the lungs 2 (two) times daily. 05/18/18   [provider]  Tiotropium Bromide Monohydrate (SPIRIVA RESPIMAT) 2.5 MCG/ACT AERS Inhale 2 puffs into the lungs daily for 1 day. 06/02/18 06/03/18  Luciano Cutter, MD  Tiotropium Bromide Monohydrate (SPIRIVA RESPIMAT) 2.5 MCG/ACT AERS Inhale 2 puffs into the lungs daily. 06/02/18   Luciano Cutter, MD    Family History Family History  Problem Relation Age of Onset  . Allergic rhinitis Mother   . Kidney disease Mother   . Allergic rhinitis Father   . Allergic rhinitis Brother   . Kidney disease Maternal Uncle   . Angioedema Neg Hx   . Asthma Neg Hx   . Atopy Neg Hx   . Eczema Neg Hx   . Immunodeficiency Neg Hx   . Urticaria Neg Hx     Social History Social History   Tobacco Use  . Smoking status: Current Every Day Smoker    Packs/day: 0.25    Years: 33.00    Pack years: 8.25    Types: Cigarettes  . Smokeless tobacco: Never Used  . Tobacco comment: one cigarette daily as of Oct 2019  Substance Use Topics  . Alcohol use: Yes    Comment: socially  . Drug use: No     Allergies   Milk-related compounds and Penicillins   Review of Systems Review of Systems  Constitutional: Negative for chills and fever.  Musculoskeletal:       Right great toe pain  Skin: Negative for wound.  All other systems reviewed and are negative.    Physical Exam Updated Vital Signs BP 127/90  (BP Location: Left Arm)   Pulse 74   Temp 98.8 F (37.1 C) (Oral)   Resp 16   Ht 5\' 3"  (1.6 m)   Wt 46.3 kg   LMP 04/02/2014 Comment: tubal ligation  SpO2 100%   BMI 18.07 kg/m   Physical Exam Vitals signs and nursing note reviewed.  Constitutional:      General: She is not in acute distress.    Appearance: She is not ill-appearing.  HENT:     Head: Normocephalic.  Cardiovascular:     Rate and Rhythm: Normal rate.  Pulmonary:  Effort: Pulmonary effort is normal. No respiratory distress.  Musculoskeletal:     Comments: Right great toe has mild edema.  There is no tenderness to palpation in the proximal or mid right foot.  There is mild tenderness to palpation along the right first MTP joint and the distal portion of the right first metatarsal.    Skin:    Comments: Right great toe is mildly red without obvious wound or drainage.  Neurological:     Mental Status: She is alert.     Comments: Sensation intact to light touch to right great toe.          ED Treatments / Results  Labs (all labs ordered are listed, but only abnormal results are displayed) Labs Reviewed - No data to display  EKG None  Radiology Dg Foot Complete Right  Result Date: 04/28/2019 CLINICAL DATA:  Great toe pain, kicked door while wearing sandals EXAM: RIGHT FOOT COMPLETE - 3+ VIEW COMPARISON:  None. FINDINGS: No acute fracture or traumatic malalignment Midfoot and hindfoot alignment is grossly preserved though incompletely assessed on nonweightbearing films. Mild soft tissue swelling of the first digit with disruption of the nail plate. Small amount of debris associated with the nail plate as well. No subcutaneous gas or other retained foreign body. IMPRESSION: Mild soft tissue swelling of the first digit with disruption of the nail plate with small amount of radiopaque debris associated with the nail plate as well. No acute fracture or traumatic malalignment Electronically Signed   By: Kreg Shropshire M.D.   On: 04/28/2019 19:21  Patient does have a ridge of nail polish near the proximal nail fold which may account for some of the reported debris.  Procedures Procedures (including critical care time)  Medications Ordered in ED Medications  Tdap (BOOSTRIX) injection 0.5 mL (has no administration in time range)     Initial Impression / Assessment and Plan / ED Course  I have reviewed the triage vital signs and the nursing notes.  Pertinent labs & imaging results that were available during my care of the patient were reviewed by me and considered in my medical decision making (see chart for details).       Patient presents today for evaluation of right great toe pain for 6 days after she kicked a door shot while wearing sandals.  X-rays were obtained showing return for nail plate fracture.  Patient does not have any pain with pressure or movement of the nail itself, I suspect that this may be an overread based on a ridge of nail polish that she has.  Given the redness of the toe, 6 days after the injury, concern for infection.  I suspect that by kicking the door she created a wound that is now infected.  Tdap is updated.  She is given a prescription for doxycycline.  She is afebrile and generally well-appearing.  Recommended PCP follow-up if redness does not start to improve in 2 to 3 days or if the swelling does not improve despite antibiotics and conservative care in 7 days or if she has any concerns.  Return precautions were discussed with patient who states their understanding.  At the time of discharge patient denied any unaddressed complaints or concerns.  Patient is agreeable for discharge home.    Final Clinical Impressions(s) / ED Diagnoses   Final diagnoses:  Great toe pain, right    ED Discharge Orders         Ordered    doxycycline (VIBRAMYCIN)  100 MG capsule  2 times daily     04/28/19 1943           Norman ClayHammond, Elizabeth W, PA-C 04/28/19 1947    Maia PlanLong,  Joshua G, MD 04/29/19 1029

## 2019-04-28 NOTE — Discharge Instructions (Addendum)
Please take Ibuprofen (Advil, motrin) and Tylenol (acetaminophen) to relieve your pain.  You may take up to 600 MG (3 pills) of normal strength ibuprofen every 8 hours as needed.  In between doses of ibuprofen you make take tylenol, up to 1,000 mg (two extra strength pills).  Do not take more than 3,000 mg tylenol in a 24 hour period.  Please check all medication labels as many medications such as pain and cold medications may contain tylenol.  Do not drink alcohol while taking these medications.  Do not take other NSAID'S while taking ibuprofen (such as aleve or naproxen).  Please take ibuprofen with food to decrease stomach upset.  You may have diarrhea from the antibiotics.  It is very important that you continue to take the antibiotics even if you get diarrhea unless a medical professional tells you that you may stop taking them.  If you stop too early the bacteria you are being treated for will become stronger and you may need different, more powerful antibiotics that have more side effects and worsening diarrhea.  Please stay well hydrated and consider probiotics as they may decrease the severity of your diarrhea.  Doxycycline may make you more likely to be sunburn.  If your symptoms worsen or you have any concerns please seek additional medical care and evaluation.  I would expect the redness to start improving after 2 to 3 days however it may take up to a week to fully resolve.  Please consider warm soaks after washing your feet.  Be careful not to burn your self.  If warm does not improve your symptoms you may do ice instead.  Please continue to elevate your foot to help with swelling.

## 2019-04-28 NOTE — ED Triage Notes (Signed)
Pt co right big toe pain that pt reports was injured x 6 days ago by kicking a door while wearing sandals.

## 2019-04-28 NOTE — ED Notes (Signed)
X-ray at bedside

## 2019-04-28 NOTE — ED Notes (Signed)
PA student at bedside.

## 2019-04-28 NOTE — ED Notes (Signed)
Family at bedside. 

## 2019-04-28 NOTE — ED Notes (Signed)
Pt verbalized understanding to pick up Rx at pharmacy listed on d/c paperwork 

## 2019-06-15 ENCOUNTER — Other Ambulatory Visit (HOSPITAL_COMMUNITY)
Admission: RE | Admit: 2019-06-15 | Discharge: 2019-06-15 | Disposition: A | Discharge status: Home / Self Care | Payer: BC Managed Care – PPO | Source: Ambulatory Visit | Attending: Orthopedic Surgery | Admitting: Orthopedic Surgery

## 2019-06-15 DIAGNOSIS — Z01812 Encounter for preprocedural laboratory examination: Secondary | ICD-10-CM | POA: Diagnosis not present

## 2019-06-15 DIAGNOSIS — Z20828 Contact with and (suspected) exposure to other viral communicable diseases: Secondary | ICD-10-CM | POA: Insufficient documentation

## 2019-06-16 LAB — NOVEL CORONAVIRUS, NAA (HOSP ORDER, SEND-OUT TO REF LAB; TAT 18-24 HRS): SARS-CoV-2, NAA: NOT DETECTED

## 2019-06-18 ENCOUNTER — Other Ambulatory Visit: Payer: Self-pay

## 2019-06-18 ENCOUNTER — Encounter (HOSPITAL_BASED_OUTPATIENT_CLINIC_OR_DEPARTMENT_OTHER): Payer: Self-pay | Admitting: *Deleted

## 2019-06-18 NOTE — Progress Notes (Signed)
Spoke w/ via phone for pre-op interview--- PT Lab needs dos----  None Lab results---- CXR/ EKG dated 01-28-2019 chart/ epic COVID test ------ 06-15-2019 Arrive at ------- 1030 NPO after ------ Mn Medications to take morning of surgery ----- Xanax, Zyrtec, Spiriva inhaler Diabetic medication ----- n/a Patient Special Instructions ----- bring rescue inhaler dos Pre-Op special Istructions ----- n/a Patient verbalized understanding of instructions that were given at this phone interview. Patient denies shortness of breath, chest pain, fever, cough a this phone interview.

## 2019-06-19 ENCOUNTER — Encounter (HOSPITAL_BASED_OUTPATIENT_CLINIC_OR_DEPARTMENT_OTHER)
Admission: RE | Disposition: A | Discharge status: Home / Self Care | Payer: Self-pay | Source: Ambulatory Visit | Attending: Orthopedic Surgery

## 2019-06-19 ENCOUNTER — Encounter (HOSPITAL_BASED_OUTPATIENT_CLINIC_OR_DEPARTMENT_OTHER): Payer: Self-pay | Admitting: Anesthesiology

## 2019-06-19 ENCOUNTER — Ambulatory Visit (HOSPITAL_BASED_OUTPATIENT_CLINIC_OR_DEPARTMENT_OTHER)
Admission: RE | Admit: 2019-06-19 | Discharge: 2019-06-19 | Disposition: A | Discharge status: Home / Self Care | Payer: BC Managed Care – PPO | Source: Ambulatory Visit | Attending: Orthopedic Surgery | Admitting: Orthopedic Surgery

## 2019-06-19 ENCOUNTER — Ambulatory Visit (HOSPITAL_BASED_OUTPATIENT_CLINIC_OR_DEPARTMENT_OTHER): Payer: BC Managed Care – PPO | Admitting: Anesthesiology

## 2019-06-19 ENCOUNTER — Other Ambulatory Visit: Payer: Self-pay

## 2019-06-19 DIAGNOSIS — F419 Anxiety disorder, unspecified: Secondary | ICD-10-CM | POA: Insufficient documentation

## 2019-06-19 DIAGNOSIS — Z79899 Other long term (current) drug therapy: Secondary | ICD-10-CM | POA: Insufficient documentation

## 2019-06-19 DIAGNOSIS — J309 Allergic rhinitis, unspecified: Secondary | ICD-10-CM | POA: Insufficient documentation

## 2019-06-19 DIAGNOSIS — J449 Chronic obstructive pulmonary disease, unspecified: Secondary | ICD-10-CM | POA: Diagnosis not present

## 2019-06-19 DIAGNOSIS — Z880 Allergy status to penicillin: Secondary | ICD-10-CM | POA: Insufficient documentation

## 2019-06-19 DIAGNOSIS — X58XXXA Exposure to other specified factors, initial encounter: Secondary | ICD-10-CM | POA: Insufficient documentation

## 2019-06-19 DIAGNOSIS — M19042 Primary osteoarthritis, left hand: Secondary | ICD-10-CM | POA: Diagnosis not present

## 2019-06-19 DIAGNOSIS — Z7951 Long term (current) use of inhaled steroids: Secondary | ICD-10-CM | POA: Diagnosis not present

## 2019-06-19 DIAGNOSIS — F17210 Nicotine dependence, cigarettes, uncomplicated: Secondary | ICD-10-CM | POA: Insufficient documentation

## 2019-06-19 DIAGNOSIS — S43431A Superior glenoid labrum lesion of right shoulder, initial encounter: Secondary | ICD-10-CM | POA: Insufficient documentation

## 2019-06-19 DIAGNOSIS — F329 Major depressive disorder, single episode, unspecified: Secondary | ICD-10-CM | POA: Diagnosis not present

## 2019-06-19 DIAGNOSIS — S43492A Other sprain of left shoulder joint, initial encounter: Secondary | ICD-10-CM | POA: Diagnosis not present

## 2019-06-19 DIAGNOSIS — M75101 Unspecified rotator cuff tear or rupture of right shoulder, not specified as traumatic: Secondary | ICD-10-CM | POA: Insufficient documentation

## 2019-06-19 DIAGNOSIS — M19041 Primary osteoarthritis, right hand: Secondary | ICD-10-CM | POA: Diagnosis not present

## 2019-06-19 DIAGNOSIS — M25811 Other specified joint disorders, right shoulder: Secondary | ICD-10-CM | POA: Insufficient documentation

## 2019-06-19 HISTORY — DX: Unspecified asthma, uncomplicated: J45.909

## 2019-06-19 HISTORY — DX: Other specified joint disorders, left shoulder: M25.812

## 2019-06-19 HISTORY — DX: Allergic rhinitis, unspecified: J30.9

## 2019-06-19 HISTORY — DX: Presence of spectacles and contact lenses: Z97.3

## 2019-06-19 HISTORY — DX: Impingement syndrome of left shoulder: M75.42

## 2019-06-19 HISTORY — DX: Unspecified osteoarthritis, unspecified site: M19.90

## 2019-06-19 HISTORY — DX: Presence of dental prosthetic device (complete) (partial): Z97.2

## 2019-06-19 HISTORY — PX: SHOULDER ARTHROSCOPY WITH ROTATOR CUFF REPAIR AND SUBACROMIAL DECOMPRESSION: SHX5686

## 2019-06-19 HISTORY — DX: Unspecified rotator cuff tear or rupture of left shoulder, not specified as traumatic: M75.102

## 2019-06-19 SURGERY — SHOULDER ARTHROSCOPY WITH ROTATOR CUFF REPAIR AND SUBACROMIAL DECOMPRESSION
Anesthesia: General | Laterality: Left

## 2019-06-19 MED ORDER — PROPOFOL 10 MG/ML IV BOLUS
INTRAVENOUS | Status: AC
  Filled 2019-06-19: qty 20

## 2019-06-19 MED ORDER — MIDAZOLAM HCL 2 MG/2ML IJ SOLN
INTRAMUSCULAR | Status: AC
  Filled 2019-06-19: qty 4

## 2019-06-19 MED ORDER — FENTANYL CITRATE (PF) 100 MCG/2ML IJ SOLN
INTRAMUSCULAR | Status: AC
  Filled 2019-06-19: qty 2

## 2019-06-19 MED ORDER — MIDAZOLAM HCL 2 MG/2ML IJ SOLN
INTRAMUSCULAR | Status: AC
  Filled 2019-06-19: qty 2

## 2019-06-19 MED ORDER — OXYCODONE HCL 5 MG/5ML PO SOLN
5.0000 mg | Freq: Once | ORAL | Status: DC | PRN
  Filled 2019-06-19: qty 5

## 2019-06-19 MED ORDER — FENTANYL CITRATE (PF) 100 MCG/2ML IJ SOLN
100.0000 ug | Freq: Once | INTRAMUSCULAR | Status: AC
  Administered 2019-06-19: 100 ug via INTRAVENOUS
  Filled 2019-06-19: qty 2

## 2019-06-19 MED ORDER — MEPERIDINE HCL 25 MG/ML IJ SOLN
6.2500 mg | INTRAMUSCULAR | Status: DC | PRN
  Filled 2019-06-19: qty 1

## 2019-06-19 MED ORDER — FENTANYL CITRATE (PF) 100 MCG/2ML IJ SOLN
INTRAMUSCULAR | Status: DC | PRN
  Administered 2019-06-19 (×4): 25 ug via INTRAVENOUS

## 2019-06-19 MED ORDER — SODIUM CHLORIDE 0.9 % IR SOLN
Status: DC | PRN
  Administered 2019-06-19: 6000 mL

## 2019-06-19 MED ORDER — BUPIVACAINE HCL (PF) 0.5 % IJ SOLN
INTRAMUSCULAR | Status: DC | PRN
  Administered 2019-06-19: 20 mL via PERINEURAL

## 2019-06-19 MED ORDER — CEFAZOLIN SODIUM-DEXTROSE 2-4 GM/100ML-% IV SOLN
2.0000 g | INTRAVENOUS | Status: DC
  Filled 2019-06-19: qty 100

## 2019-06-19 MED ORDER — PROMETHAZINE HCL 25 MG/ML IJ SOLN
6.2500 mg | INTRAMUSCULAR | Status: DC | PRN
  Filled 2019-06-19: qty 1

## 2019-06-19 MED ORDER — OXYCODONE HCL 5 MG PO TABS
5.0000 mg | ORAL_TABLET | Freq: Once | ORAL | Status: DC | PRN
  Filled 2019-06-19: qty 1

## 2019-06-19 MED ORDER — CHLORHEXIDINE GLUCONATE 4 % EX LIQD
60.0000 mL | Freq: Once | CUTANEOUS | Status: DC
  Filled 2019-06-19: qty 118

## 2019-06-19 MED ORDER — CEFAZOLIN SODIUM-DEXTROSE 2-3 GM-%(50ML) IV SOLR
INTRAVENOUS | Status: DC | PRN
  Administered 2019-06-19: 2 g via INTRAVENOUS

## 2019-06-19 MED ORDER — PROPOFOL 10 MG/ML IV BOLUS
INTRAVENOUS | Status: DC | PRN
  Administered 2019-06-19: 200 mg via INTRAVENOUS

## 2019-06-19 MED ORDER — BUPIVACAINE LIPOSOME 1.3 % IJ SUSP
INTRAMUSCULAR | Status: DC | PRN
  Administered 2019-06-19: 10 mL via PERINEURAL

## 2019-06-19 MED ORDER — LACTATED RINGERS IV SOLN
INTRAVENOUS | Status: DC
  Administered 2019-06-19 (×2): via INTRAVENOUS
  Filled 2019-06-19: qty 1000

## 2019-06-19 MED ORDER — ONDANSETRON HCL 4 MG/2ML IJ SOLN
INTRAMUSCULAR | Status: AC
  Filled 2019-06-19: qty 2

## 2019-06-19 MED ORDER — HYDROMORPHONE HCL 1 MG/ML IJ SOLN
0.2500 mg | INTRAMUSCULAR | Status: DC | PRN
  Filled 2019-06-19: qty 0.5

## 2019-06-19 MED ORDER — SCOPOLAMINE 1 MG/3DAYS TD PT72
MEDICATED_PATCH | TRANSDERMAL | Status: AC
  Filled 2019-06-19: qty 1

## 2019-06-19 MED ORDER — OXYCODONE HCL 5 MG PO TABS: 5.0000 mg | ORAL_TABLET | Freq: Four times a day (QID) | ORAL | 0 refills | Status: AC | PRN

## 2019-06-19 MED ORDER — LIDOCAINE 2% (20 MG/ML) 5 ML SYRINGE
INTRAMUSCULAR | Status: AC
  Filled 2019-06-19: qty 5

## 2019-06-19 MED ORDER — SCOPOLAMINE 1 MG/3DAYS TD PT72
MEDICATED_PATCH | TRANSDERMAL | Status: DC | PRN
  Administered 2019-06-19: 1 via TRANSDERMAL

## 2019-06-19 MED ORDER — DEXAMETHASONE SODIUM PHOSPHATE 10 MG/ML IJ SOLN
INTRAMUSCULAR | Status: AC
  Filled 2019-06-19: qty 1

## 2019-06-19 MED ORDER — CEFAZOLIN SODIUM-DEXTROSE 2-4 GM/100ML-% IV SOLN
INTRAVENOUS | Status: AC
  Filled 2019-06-19: qty 100

## 2019-06-19 MED ORDER — ONDANSETRON HCL 4 MG/2ML IJ SOLN
INTRAMUSCULAR | Status: DC | PRN
  Administered 2019-06-19: 4 mg via INTRAVENOUS

## 2019-06-19 MED ORDER — LIDOCAINE HCL (CARDIAC) PF 100 MG/5ML IV SOSY
PREFILLED_SYRINGE | INTRAVENOUS | Status: DC | PRN
  Administered 2019-06-19: 60 mg via INTRAVENOUS

## 2019-06-19 MED ORDER — DEXAMETHASONE SODIUM PHOSPHATE 10 MG/ML IJ SOLN
INTRAMUSCULAR | Status: DC | PRN
  Administered 2019-06-19: 10 mg via INTRAVENOUS

## 2019-06-19 MED ORDER — MIDAZOLAM HCL 5 MG/5ML IJ SOLN
INTRAMUSCULAR | Status: DC | PRN
  Administered 2019-06-19: 2 mg via INTRAVENOUS

## 2019-06-19 MED ORDER — ONDANSETRON 4 MG PO TBDP: 4.0000 mg | ORAL_TABLET | Freq: Three times a day (TID) | ORAL | 0 refills | Status: AC | PRN

## 2019-06-19 MED ORDER — MIDAZOLAM HCL 2 MG/2ML IJ SOLN
2.0000 mg | Freq: Once | INTRAMUSCULAR | Status: AC
  Administered 2019-06-19: 2 mg via INTRAVENOUS
  Filled 2019-06-19: qty 2

## 2019-06-19 SURGICAL SUPPLY — 87 items
ANCH SUT SWLK 17.9X3.9 BCMPS (Anchor) ×1 IMPLANT
ANCHOR SWIVELOCK BC 3.9X17.9 (Anchor) ×2 IMPLANT
BLADE SURG 11 STRL SS (BLADE) ×3 IMPLANT
BLADE SURG 15 STRL LF DISP TIS (BLADE) IMPLANT
BLADE SURG 15 STRL SS (BLADE)
BURR OVAL 8 FLU 4.0MM X 13CM (MISCELLANEOUS) ×1
BURR OVAL 8 FLU 4.0X13 (MISCELLANEOUS) ×2 IMPLANT
CANNULA 5.75X7 CRYSTAL CLEAR (CANNULA) ×3 IMPLANT
CANNULA 5.75X71 LONG (CANNULA) IMPLANT
CANNULA TWIST IN 8.25X7CM (CANNULA) ×2 IMPLANT
CLOSURE WOUND 1/2 X4 (GAUZE/BANDAGES/DRESSINGS) ×1
CONNECTOR 5 IN 1 STRAIGHT STRL (MISCELLANEOUS) ×3 IMPLANT
COVER WAND RF STERILE (DRAPES) ×3 IMPLANT
DISSECTOR  3.8MM X 13CM (MISCELLANEOUS) ×2
DISSECTOR 3.8MM X 13CM (MISCELLANEOUS) ×1 IMPLANT
DRAPE ORTHO SPLIT 77X108 STRL (DRAPES) ×6
DRAPE POUCH INSTRU U-SHP 10X18 (DRAPES) ×3 IMPLANT
DRAPE SHEET LG 3/4 BI-LAMINATE (DRAPES) ×3 IMPLANT
DRAPE STERI 35X30 U-POUCH (DRAPES) ×3 IMPLANT
DRAPE SURG 17X23 STRL (DRAPES) ×3 IMPLANT
DRAPE SURG ORHT 6 SPLT 77X108 (DRAPES) ×2 IMPLANT
DRAPE U-SHAPE 47X51 STRL (DRAPES) ×3 IMPLANT
DRSG EMULSION OIL 3X3 NADH (GAUZE/BANDAGES/DRESSINGS) ×3 IMPLANT
DRSG PAD ABDOMINAL 8X10 ST (GAUZE/BANDAGES/DRESSINGS) ×3 IMPLANT
DURAPREP 26ML APPLICATOR (WOUND CARE) ×3 IMPLANT
ELECT REM PT RETURN 9FT ADLT (ELECTROSURGICAL)
ELECTRODE REM PT RTRN 9FT ADLT (ELECTROSURGICAL) IMPLANT
FIBERSTICK 2 (SUTURE) IMPLANT
GAUZE SPONGE 4X4 12PLY STRL (GAUZE/BANDAGES/DRESSINGS) ×3 IMPLANT
GAUZE SPONGE 4X4 12PLY STRL LF (GAUZE/BANDAGES/DRESSINGS) ×2 IMPLANT
GLOVE BIO SURGEON STRL SZ7.5 (GLOVE) ×3 IMPLANT
GLOVE BIOGEL PI IND STRL 8 (GLOVE) ×1 IMPLANT
GLOVE BIOGEL PI INDICATOR 8 (GLOVE) ×2
GOWN STRL REUS W/TWL LRG LVL3 (GOWN DISPOSABLE) ×3 IMPLANT
IV NS IRRIG 3000ML ARTHROMATIC (IV SOLUTION) ×6 IMPLANT
KIT TURNOVER CYSTO (KITS) ×3 IMPLANT
LASSO SUT 90 DEGREE (SUTURE) IMPLANT
LOOP 2 FIBERLINK CLOSED (SUTURE) IMPLANT
MANIFOLD NEPTUNE II (INSTRUMENTS) ×3 IMPLANT
NDL FILTER BLUNT 18X1 1/2 (NEEDLE) ×1 IMPLANT
NDL MAYO 6 CRC TAPER PT (NEEDLE) IMPLANT
NDL MAYO CATGUT SZ4 TPR NDL (NEEDLE) IMPLANT
NDL SAFETY ECLIPSE 18X1.5 (NEEDLE) IMPLANT
NDL SCORPION MULTI FIRE (NEEDLE) IMPLANT
NEEDLE FILTER BLUNT 18X 1/2SAF (NEEDLE) ×2
NEEDLE FILTER BLUNT 18X1 1/2 (NEEDLE) ×1 IMPLANT
NEEDLE HYPO 18GX1.5 SHARP (NEEDLE)
NEEDLE MAYO 6 CRC TAPER PT (NEEDLE) IMPLANT
NEEDLE MAYO CATGUT SZ4 (NEEDLE) IMPLANT
NEEDLE SCORPION MULTI FIRE (NEEDLE) IMPLANT
NS IRRIG 500ML POUR BTL (IV SOLUTION) ×3 IMPLANT
PACK ARTHROSCOPY DSU (CUSTOM PROCEDURE TRAY) ×3 IMPLANT
PACK BASIN DAY SURGERY FS (CUSTOM PROCEDURE TRAY) ×3 IMPLANT
PAD ABD 8X10 STRL (GAUZE/BANDAGES/DRESSINGS) ×2 IMPLANT
PAD ARMBOARD 7.5X6 YLW CONV (MISCELLANEOUS) IMPLANT
PASSER SUT SWIFTSTITCH HIP CRT (INSTRUMENTS) ×2 IMPLANT
PENCIL BUTTON HOLSTER BLD 10FT (ELECTRODE) IMPLANT
PROBE APOLLO 90XL (SURGICAL WAND) ×3 IMPLANT
SLEEVE ARM SUSPENSION SYSTEM (MISCELLANEOUS) ×3 IMPLANT
SLING S3 LATERAL DISP (MISCELLANEOUS) ×3 IMPLANT
SLING ULTRA II AB L (ORTHOPEDIC SUPPLIES) IMPLANT
SLING ULTRA III MED (ORTHOPEDIC SUPPLIES) ×2 IMPLANT
SPONGE LAP 4X18 RFD (DISPOSABLE) IMPLANT
STRIP CLOSURE SKIN 1/2X4 (GAUZE/BANDAGES/DRESSINGS) ×1 IMPLANT
SUCTION FRAZIER HANDLE 10FR (MISCELLANEOUS)
SUCTION TUBE FRAZIER 10FR DISP (MISCELLANEOUS) IMPLANT
SUT 2 FIBERLOOP 20 STRT BLUE (SUTURE)
SUT ETHILON 3 0 PS 1 (SUTURE) ×3 IMPLANT
SUT FIBERWIRE #2 38 T-5 BLUE (SUTURE)
SUT LASSO 45 DEGREE LEFT (SUTURE) IMPLANT
SUT LASSO 45D RIGHT (SUTURE) IMPLANT
SUT MNCRL AB 3-0 PS2 18 (SUTURE) ×2 IMPLANT
SUT PDS AB 0 CT1 36 (SUTURE) IMPLANT
SUT TIGER TAPE 7 IN WHITE (SUTURE) IMPLANT
SUT VIC AB 0 CT1 36 (SUTURE) IMPLANT
SUT VIC AB 2-0 CT1 27 (SUTURE)
SUT VIC AB 2-0 CT1 TAPERPNT 27 (SUTURE) IMPLANT
SUTURE 2 FIBERLOOP 20 STRT BLU (SUTURE) IMPLANT
SUTURE FIBERWR #2 38 T-5 BLUE (SUTURE) IMPLANT
SYR CONTROL 10ML LL (SYRINGE) IMPLANT
SYR TB 1ML LL NO SAFETY (SYRINGE) IMPLANT
TAPE CLOTH SURG 6X10 WHT LF (GAUZE/BANDAGES/DRESSINGS) ×3 IMPLANT
TOWEL OR 17X26 10 PK STRL BLUE (TOWEL DISPOSABLE) ×3 IMPLANT
TUBE CONNECTING 12'X1/4 (SUCTIONS) ×2
TUBE CONNECTING 12X1/4 (SUCTIONS) ×4 IMPLANT
TUBING ARTHROSCOPY IRRIG 16FT (MISCELLANEOUS) ×3 IMPLANT
YANKAUER SUCT BULB TIP NO VENT (SUCTIONS) IMPLANT

## 2019-06-19 NOTE — Brief Op Note (Signed)
06/19/2019  1:53 PM  PATIENT:  Makayla Reid  48 y.o. female  PRE-OPERATIVE DIAGNOSIS:  Left shoulder partial rotator cuff tear, impingement, labrum tear  POST-OPERATIVE DIAGNOSIS:  Left shoulder partial rotator cuff tear, impingement, labrum tear  PROCEDURE:  Procedure(s) with comments: Left shoulder arthroscopic extensive debridement, subacromial decompression, BICEPS TENDONESIS (Left) - 90 mins  SURGEON:  Surgeon(s) and Role:    * Stann Mainland, Elly Modena, MD - Primary  PHYSICIAN ASSISTANT:   ASSISTANTS: none   ANESTHESIA:   regional and general  EBL:  15 mL   BLOOD ADMINISTERED:none  DRAINS: none   LOCAL MEDICATIONS USED:  NONE  SPECIMEN:  No Specimen  DISPOSITION OF SPECIMEN:  N/A  COUNTS:  YES  TOURNIQUET:  * No tourniquets in log *  DICTATION: .Note written in EPIC  PLAN OF CARE: Discharge to home after PACU  PATIENT DISPOSITION:  PACU - hemodynamically stable.   Delay start of Pharmacological VTE agent (>24hrs) due to surgical blood loss or risk of bleeding: not applicable

## 2019-06-19 NOTE — Discharge Instructions (Signed)
Maintain the left upper extremity in the sling at all times.  No weight lifting with the left arm.  You may remove the sling in 3 days while you begin showering.  -Maintain your postoperative bandage for 3 days.  You may remove this on the third day and begin showering, but do not submerge underwater.  -You will follow-up with Dr. Aundria Rud in 2 weeks for routine postoperative wound check.  -Apply ice to the left shoulder for 20 to 30 minutes/h that you are awake.  -For mild to moderate pain use Tylenol and Advil around-the-clock.  For breakthrough pain use oxycodone as directed.   Pendulum exercises only until follow up appointment.   Post Anesthesia Home Care Instructions  Activity: Get plenty of rest for the remainder of the day. A responsible individual must stay with you for 24 hours following the procedure.  For the next 24 hours, DO NOT: -Drive a car -Advertising copywriter -Drink alcoholic beverages -Take any medication unless instructed by your physician -Make any legal decisions or sign important papers.  Meals: Start with liquid foods such as gelatin or soup. Progress to regular foods as tolerated. Avoid greasy, spicy, heavy foods. If nausea and/or vomiting occur, drink only clear liquids until the nausea and/or vomiting subsides. Call your physician if vomiting continues.  Special Instructions/Symptoms: Your throat may feel dry or sore from the anesthesia or the breathing tube placed in your throat during surgery. If this causes discomfort, gargle with warm salt water. The discomfort should disappear within 24 hours.  If you had a scopolamine patch placed behind your ear for the management of post- operative nausea and/or vomiting:  1. The medication in the patch is effective for 72 hours, after which it should be removed.  Wrap patch in a tissue and discard in the trash. Wash hands thoroughly with soap and water. 2. You may remove the patch earlier than 72 hours if you  experience unpleasant side effects which may include dry mouth, dizziness or visual disturbances. 3. Avoid touching the patch. Wash your hands with soap and water after contact with the patch.    May remove patch behind ear on Friday, June 22, 2019.  Regional Anesthesia Blocks  1. Numbness or the inability to move the "blocked" extremity may last from 3-48 hours after placement. The length of time depends on the medication injected and your individual response to the medication. If the numbness is not going away after 48 hours, call your surgeon.  2. The extremity that is blocked will need to be protected until the numbness is gone and the  Strength has returned. Because you cannot feel it, you will need to take extra care to avoid injury. Because it may be weak, you may have difficulty moving it or using it. You may not know what position it is in without looking at it while the block is in effect.  3. For blocks in the legs and feet, returning to weight bearing and walking needs to be done carefully. You will need to wait until the numbness is entirely gone and the strength has returned. You should be able to move your leg and foot normally before you try and bear weight or walk. You will need someone to be with you when you first try to ensure you do not fall and possibly risk injury.  4. Bruising and tenderness at the needle site are common side effects and will resolve in a few days.  5. Persistent numbness or new problems  with movement should be communicated to the surgeon. 6. Call your surgeon for changes in sensation or color to the operative hand.Information for Discharge Teaching: EXPAREL (bupivacaine liposome injectable suspension)   Your surgeon or anesthesiologist gave you EXPAREL(bupivacaine) to help control your pain after surgery.   EXPAREL is a local anesthetic that provides pain relief by numbing the tissue around the surgical site.  EXPAREL is designed to release pain  medication over time and can control pain for up to 72 hours.  Depending on how you respond to EXPAREL, you may require less pain medication during your recovery.  Possible side effects:  Temporary loss of sensation or ability to move in the area where bupivacaine was injected.  Nausea, vomiting, constipation  Rarely, numbness and tingling in your mouth or lips, lightheadedness, or anxiety may occur.  Call your doctor right away if you think you may be experiencing any of these sensations, or if you have other questions regarding possible side effects.  Follow all other discharge instructions given to you by your surgeon or nurse. Eat a healthy diet and drink plenty of water or other fluids.  If you return to the hospital for any reason within 96 hours following the administration of EXPAREL, it is important for health care providers to know that you have received this anesthetic. A teal colored band has been placed on your arm with the date, time and amount of EXPAREL you have received in order to alert and inform your health care providers. Please leave this armband in place for the full 96 hours following administration, and then you may remove the band.  May remove green armband on June 23, 2019.

## 2019-06-19 NOTE — Anesthesia Procedure Notes (Signed)
Procedure Name: LMA Insertion Date/Time: 06/19/2019 12:37 PM Performed by: Justice Rocher, CRNA Pre-anesthesia Checklist: Patient identified, Emergency Drugs available, Suction available and Patient being monitored Patient Re-evaluated:Patient Re-evaluated prior to induction Oxygen Delivery Method: Circle system utilized Preoxygenation: Pre-oxygenation with 100% oxygen Induction Type: IV induction Ventilation: Mask ventilation without difficulty LMA: LMA inserted LMA Size: 4.0 Number of attempts: 1 Airway Equipment and Method: Bite block Placement Confirmation: positive ETCO2 and breath sounds checked- equal and bilateral Tube secured with: Tape Dental Injury: Teeth and Oropharynx as per pre-operative assessment

## 2019-06-19 NOTE — Progress Notes (Signed)
Assisted Dr. Miller with left, ultrasound guided, interscalene  block. Side rails up, monitors on throughout procedure. See vital signs in flow sheet. Tolerated Procedure well.  

## 2019-06-19 NOTE — Transfer of Care (Signed)
Immediate Anesthesia Transfer of Care Note  Patient: Makayla Reid  Procedure(s) Performed: Procedure(s) (LRB): Left shoulder arthroscopic extensive debridement, subacromial decompression, BICEPS TENDONESIS (Left)  Patient Location: PACU  Anesthesia Type: General  Level of Consciousness: awake, sedated, patient cooperative and responds to stimulation  Airway & Oxygen Therapy: Patient Spontanous Breathing and Patient connected to [place] O2 and soft FM  Post-op Assessment: Report given to PACU RN, Post -op Vital signs reviewed and stable and Patient moving all extremities  Post vital signs: Reviewed and stable  Complications: No apparent anesthesia complications

## 2019-06-19 NOTE — Progress Notes (Signed)
O2 sat up to 92% on room air. Dr. Sabra Heck in to see. Respiratory status unchanged and stable. Dressed self without difficulty.
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
## 2019-06-19 NOTE — Anesthesia Procedure Notes (Signed)
Anesthesia Regional Block: Interscalene brachial plexus block   Pre-Anesthetic Checklist: ,, timeout performed, Correct Patient, Correct Site, Correct Laterality, Correct Procedure, Correct Position, site marked, Risks and benefits discussed,  Surgical consent,  Pre-op evaluation,  At surgeon's request and post-op pain management  Laterality: Left  Prep: chloraprep       Needles:  Injection technique: Single-shot  Needle Type: Stimiplex     Needle Length: 9cm  Needle Gauge: 21     Additional Needles:   Procedures:,,,, ultrasound used (permanent image in chart),,,,  Narrative:  Start time: 06/19/2019 12:14 PM End time: 06/19/2019 12:19 PM Injection made incrementally with aspirations every 5 mL.  Performed by: Personally  Anesthesiologist: Lynda Rainwater, MD

## 2019-06-19 NOTE — Anesthesia Preprocedure Evaluation (Signed)
Anesthesia Evaluation  Patient identified by MRN, date of birth, ID band Patient awake    Reviewed: Allergy & Precautions, NPO status , Patient's Chart, lab work & pertinent test results  Airway Mallampati: II  TM Distance: >3 FB Neck ROM: Full    Dental no notable dental hx.    Pulmonary neg pulmonary ROS, COPD, Current Smoker and Patient abstained from smoking.,    Pulmonary exam normal breath sounds clear to auscultation       Cardiovascular negative cardio ROS Normal cardiovascular exam Rhythm:Regular Rate:Normal     Neuro/Psych  Headaches, Anxiety Depression negative neurological ROS  negative psych ROS   GI/Hepatic negative GI ROS, Neg liver ROS, GERD  ,  Endo/Other  negative endocrine ROS  Renal/GU negative Renal ROS  negative genitourinary   Musculoskeletal  (+) Arthritis , Osteoarthritis,    Abdominal   Peds negative pediatric ROS (+)  Hematology negative hematology ROS (+)   Anesthesia Other Findings   Reproductive/Obstetrics negative OB ROS                             Anesthesia Physical  Anesthesia Plan  ASA: II  Anesthesia Plan: General   Post-op Pain Management:  Regional for Post-op pain   Induction: Intravenous  PONV Risk Score and Plan: 2 and Ondansetron, Midazolam and Treatment may vary due to age or medical condition  Airway Management Planned: LMA  Additional Equipment:   Intra-op Plan:   Post-operative Plan: Extubation in OR  Informed Consent: I have reviewed the patients History and Physical, chart, labs and discussed the procedure including the risks, benefits and alternatives for the proposed anesthesia with the patient or authorized representative who has indicated his/her understanding and acceptance.     Dental advisory given  Plan Discussed with: CRNA  Anesthesia Plan Comments:         Anesthesia Quick Evaluation

## 2019-06-19 NOTE — Op Note (Signed)
06/19/2019   PATIENT:  Makayla Reid    PRE-OPERATIVE DIAGNOSIS:   1. Right shoulder labrum tear 2. Right shoulder rotator cuff tear 3.  Right shoulder subacromial impingement  POST-OPERATIVE DIAGNOSIS:   1. Right shoulder labrum tear (type II SLAP) 2. Right shoulder rotator cuff tear (partial articular tear of suprspinatus and partial bursal tear of infraspinatus) 3.  Right shoulder subacromial impingement 4.  Degenerative anterior labrum tear, left shoulder  PROCEDURE:  1.  Right shoulder arthroscopic biceps tenodesis, suprapectoral. 2.  Right shoulder arthroscopic extensive debridement including rotator interval, supraspinatus partial tear, infraspinatus partial tear, anterior labrum, superior labrum, and extensive bursectomy. 3.  Right shoulder arthroscopic subacromial decompression without coracoacromial ligament release.  SURGEON:  Yolonda Kida, MD  PHYSICIAN ASSISTANT: None  ANESTHESIA:   General and interscalene  ESTIMATED BLOOD LOSS: 15 cc  PREOPERATIVE INDICATIONS:  Makayla Reid is a  48 y.o. female with a diagnosis of Left shoulder partial rotator cuff tear, impingement, labrum tear who failed conservative measures and elected for surgical management.    The risks benefits and alternatives were discussed with the patient preoperatively including but not limited to the risks of infection, bleeding, nerve injury, cardiopulmonary complications, the need for revision surgery, among others, and the patient was willing to proceed.  OPERATIVE IMPLANTS: Arthrex bio composite 3.9 mm swivel lock for biceps tenodesis at the top of the biceps groove  OPERATIVE FINDINGS:  Following application of general anesthetic the patient was examined under anesthesia.  Forward elevation was noted to be 170 degrees, external rotation 80 degrees and internal rotation 70 degrees with the arm at the side.  Diagnostic arthroscopy demonstrated no chondromalacia of the humeral head  or glenoid fossa.  There was a unstable type II superior labrum tear.  Degenerative labral tears were also noted anterior and posterior.  She had extensive arthrofibrosis in the rotator interval and inferiorly to the 6 o'clock position.  Subscapularis was intact without tearing.  Teres minor was intact without tearing.  On the articular surface there was partial tearing of the supraspinatus less than 50%.  In the subacromial space there was extensive bursitis with erythema noted at the myotendinous junction of the infraspinatus with no full-thickness tears noted.  Also of note, at the intra-articular portion of the long head of the biceps tendon there was split tears noted as it coursed around the lesser tuberosity.   OPERATIVE PROCEDURE: The patient was brought to the operating room and placed in the supine position. General anesthesia was administered. IV antibiotics were given. General anesthesia was administered.   The upper extremity was examined and found to be grossly unstable particularly to anterior testing. The upper extremity was prepped and draped in the usual sterile fashion. The patient was in a semilateral decubitus position.  Time out was performed. Diagnostic arthroscopy was carried out the above-named findings.   While working from our posterior portal which was established 2 cm distant and 1 cm medial to the posterior lateral tip of the acromium we established an anterior working portal through the rotator interval.  Of note the rotator interval was quite injected with synovitis and capsulitis which was noted by the thickened capsular tissue.  We began with preparing for the biceps tenodesis.  A suture passer was used to pass a loop suture around the proximal biceps tendon.  We then pierced to the tendon with a second half racking stitch.  The tendon was then tenotomized from the superior labrum.  Next, we  prepared an area just superior to the superior border of the subscapularis tendon  which was intact.  We then placed a pilot hole just superior to the lesser tuberosity.  The free end of the suture was placed in the 3.9 mm swivel lock anchor.  This was then impacted into place and then screwed down until it was flush with the humeral head.  This had nice purchase.  We next performed extensive debridement via arthroscopic instrumentation.  We initiated this with the radiofrequency wand and the rotator interval.  There was extensive inflammatory tissue.  This was removed widely from the leading edge of the supraspinatus tendon to the upper border of the subscapularis.  We then released the middle glenohumeral ligament with the radiofrequency wand.  We then moved to do a partial capsular release on the anterior inferior quadrant with the radiofrequency wand.  Next we debrided with the motorized shaver the anterior and superior labrum.  Superior labrum was noted to previously be unstable with its attachment to the biceps.  The remaining flap tears were then debrided.  Likewise, the undersurface of the partial tear of the articular side of the supraspinatus was lightly debrided.  In the subacromial space we did also extensive bursectomy of the subacromial bursa and subdeltoid bursa.  Lastly, we moved to the subacromial space.  A lateral working portal was established off the posterior aspect of the distal clavicle and about 3 cm lateral to the lateral acromion.  This was established via direct spinal needle localization.  While viewing lateral and working posterior we proceeded with the subacromial decompression.  The radiofrequency wand was used to remove any soft tissue attachment from the undersurface of the acromion.  A type II acromion was identified.  Next the motorized bur was introduced from the posterior portal and utilizing a cutting block technique the anterior spur was resected.  This developed a nice type I acromion.  Pictures were taken throughout the procedure.  All arthroscopic  instruments were removed from the shoulder.  Fluid was allowed to extravasate.  We then closed portals with buried 3-0 Monocryl sutures and Steri-Strips.  Standard sterile dressings were applied.  The arm was placed in an abduction sling.  All counts were correct x2.  The patient was awakened from general anesthetic in stable condition.  Disposition:  The patient will be nonweightbearing with an abduction sling to the operative extremity.  She may begin scapular retractions and elbow hand and wrist range motion as tolerated.  Will begin physical therapy in 1 week.  I will see them back in the office in 2 weeks for a wound check.

## 2019-06-19 NOTE — Anesthesia Postprocedure Evaluation (Signed)
Anesthesia Post Note  Patient: Makayla Reid  Procedure(s) Performed: Left shoulder arthroscopic extensive debridement, subacromial decompression, BICEPS TENDONESIS (Left )     Patient location during evaluation: PACU Anesthesia Type: General Level of consciousness: awake and alert Pain management: pain level controlled Vital Signs Assessment: post-procedure vital signs reviewed and stable Respiratory status: spontaneous breathing, nonlabored ventilation and respiratory function stable Cardiovascular status: blood pressure returned to baseline and stable Postop Assessment: no apparent nausea or vomiting Anesthetic complications: no    Last Vitals:  Vitals:   06/19/19 1445 06/19/19 1500  BP: (!) 147/96 (!) 120/97  Pulse: 74 80  Resp: 20 17  Temp:    SpO2: 92% 90%    Last Pain:  Vitals:   06/19/19 1445  TempSrc:   PainSc: 0-No pain                 Lynda Rainwater

## 2019-06-19 NOTE — Progress Notes (Signed)
Dr. Sabra Heck aware of low O2 sats, coughing and deep breathing exercises done, and incentive spirometry activity. Cough continues congested and non productive. Awake and alert, no shortness of breath, bilateral breath sounds clear to auscultation. Ok to move to Phase 2.

## 2019-06-21 ENCOUNTER — Emergency Department (HOSPITAL_COMMUNITY)
Admission: EM | Admit: 2019-06-21 | Discharge: 2019-06-21 | Disposition: A | Discharge status: Home / Self Care | Payer: BC Managed Care – PPO | Attending: Emergency Medicine | Admitting: Emergency Medicine

## 2019-06-21 ENCOUNTER — Emergency Department (HOSPITAL_COMMUNITY): Payer: BC Managed Care – PPO

## 2019-06-21 ENCOUNTER — Other Ambulatory Visit: Payer: Self-pay

## 2019-06-21 ENCOUNTER — Encounter (HOSPITAL_BASED_OUTPATIENT_CLINIC_OR_DEPARTMENT_OTHER): Payer: Self-pay | Admitting: Orthopedic Surgery

## 2019-06-21 DIAGNOSIS — J449 Chronic obstructive pulmonary disease, unspecified: Secondary | ICD-10-CM | POA: Insufficient documentation

## 2019-06-21 DIAGNOSIS — R251 Tremor, unspecified: Secondary | ICD-10-CM | POA: Insufficient documentation

## 2019-06-21 DIAGNOSIS — T50905A Adverse effect of unspecified drugs, medicaments and biological substances, initial encounter: Secondary | ICD-10-CM

## 2019-06-21 DIAGNOSIS — R55 Syncope and collapse: Secondary | ICD-10-CM | POA: Insufficient documentation

## 2019-06-21 DIAGNOSIS — F17210 Nicotine dependence, cigarettes, uncomplicated: Secondary | ICD-10-CM | POA: Diagnosis not present

## 2019-06-21 DIAGNOSIS — R569 Unspecified convulsions: Secondary | ICD-10-CM | POA: Diagnosis present

## 2019-06-21 DIAGNOSIS — Z79899 Other long term (current) drug therapy: Secondary | ICD-10-CM | POA: Diagnosis not present

## 2019-06-21 LAB — BASIC METABOLIC PANEL
Anion gap: 8 (ref 5–15)
BUN: 9 mg/dL (ref 6–20)
CO2: 23 mmol/L (ref 22–32)
Calcium: 8.5 mg/dL — ABNORMAL LOW (ref 8.9–10.3)
Chloride: 108 mmol/L (ref 98–111)
Creatinine, Ser: 0.51 mg/dL (ref 0.44–1.00)
GFR calc Af Amer: >60 mL/min (ref >60)
GFR calc non Af Amer: >60 mL/min (ref >60)
Glucose, Bld: 120 mg/dL — ABNORMAL HIGH (ref 70–99)
Potassium: 3.2 mmol/L — ABNORMAL LOW (ref 3.5–5.1)
Sodium: 139 mmol/L (ref 135–145)

## 2019-06-21 LAB — CBC WITH DIFFERENTIAL/PLATELET
Abs Immature Granulocytes: 0.05 10*3/uL (ref 0.00–0.07)
Basophils Absolute: 0 10*3/uL (ref 0.0–0.1)
Basophils Relative: 0 %
Eosinophils Absolute: 0 10*3/uL (ref 0.0–0.5)
Eosinophils Relative: 0 %
HCT: 36.9 % (ref 36.0–46.0)
Hemoglobin: 12.1 g/dL (ref 12.0–15.0)
Immature Granulocytes: 1 %
Lymphocytes Relative: 11 %
Lymphs Abs: 1.1 10*3/uL (ref 0.7–4.0)
MCH: 32.6 pg (ref 26.0–34.0)
MCHC: 32.8 g/dL (ref 30.0–36.0)
MCV: 99.5 fL (ref 80.0–100.0)
Monocytes Absolute: 0.6 10*3/uL (ref 0.1–1.0)
Monocytes Relative: 6 %
Neutro Abs: 8 10*3/uL — ABNORMAL HIGH (ref 1.7–7.7)
Neutrophils Relative %: 82 %
Platelets: 153 10*3/uL (ref 150–400)
RBC: 3.71 MIL/uL — ABNORMAL LOW (ref 3.87–5.11)
RDW: 11.9 % (ref 11.5–15.5)
WBC: 9.8 10*3/uL (ref 4.0–10.5)
nRBC: 0 % (ref 0.0–0.2)

## 2019-06-21 LAB — ETHANOL: Alcohol, Ethyl (B): <10 mg/dL (ref <10)
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
## 2019-06-21 NOTE — ED Notes (Signed)
Pt returned from CT °

## 2019-06-21 NOTE — ED Triage Notes (Signed)
Pt had shoulder surgery on Tuesday, has been taking percocet for pain, states she was in the bathtub when she feels like she passed out.  Pt's husband found pt in the tub and that she was "shaking all over". Pt denies injury or fall.

## 2019-06-21 NOTE — Discharge Instructions (Addendum)
Begin taking ibuprofen 600 mg every 6 hours as needed for pain.    Rest.  Follow-up with your primary doctor if you experience any new and/or concerning symptoms.

## 2020-07-29 ENCOUNTER — Other Ambulatory Visit: Payer: Self-pay | Admitting: Obstetrics and Gynecology

## 2020-07-29 DIAGNOSIS — R928 Other abnormal and inconclusive findings on diagnostic imaging of breast: Secondary | ICD-10-CM

## 2020-08-05 ENCOUNTER — Other Ambulatory Visit: Payer: Self-pay

## 2020-08-05 ENCOUNTER — Other Ambulatory Visit: Payer: Self-pay | Admitting: Obstetrics and Gynecology

## 2020-08-05 ENCOUNTER — Ambulatory Visit
Admission: RE | Admit: 2020-08-05 | Discharge: 2020-08-05 | Disposition: A | Discharge status: Home / Self Care | Payer: BC Managed Care – PPO | Source: Ambulatory Visit | Attending: Obstetrics and Gynecology | Admitting: Obstetrics and Gynecology

## 2020-08-05 DIAGNOSIS — R928 Other abnormal and inconclusive findings on diagnostic imaging of breast: Secondary | ICD-10-CM

## 2020-08-05 DIAGNOSIS — R921 Mammographic calcification found on diagnostic imaging of breast: Secondary | ICD-10-CM

## 2020-08-12 ENCOUNTER — Ambulatory Visit
Admission: RE | Admit: 2020-08-12 | Discharge: 2020-08-12 | Disposition: A | Discharge status: Home / Self Care | Payer: Self-pay | Source: Ambulatory Visit | Attending: Obstetrics and Gynecology | Admitting: Obstetrics and Gynecology

## 2020-08-12 ENCOUNTER — Other Ambulatory Visit: Payer: Self-pay

## 2020-08-12 ENCOUNTER — Ambulatory Visit
Admission: RE | Admit: 2020-08-12 | Discharge: 2020-08-12 | Disposition: A | Discharge status: Home / Self Care | Payer: 59 | Source: Ambulatory Visit | Attending: Obstetrics and Gynecology | Admitting: Obstetrics and Gynecology

## 2020-08-12 DIAGNOSIS — R921 Mammographic calcification found on diagnostic imaging of breast: Secondary | ICD-10-CM

## 2021-02-05 ENCOUNTER — Emergency Department (HOSPITAL_BASED_OUTPATIENT_CLINIC_OR_DEPARTMENT_OTHER): Payer: 59

## 2021-02-05 ENCOUNTER — Other Ambulatory Visit: Payer: Self-pay

## 2021-02-05 ENCOUNTER — Emergency Department (HOSPITAL_BASED_OUTPATIENT_CLINIC_OR_DEPARTMENT_OTHER)
Admission: EM | Admit: 2021-02-05 | Discharge: 2021-02-05 | Disposition: A | Discharge status: Home / Self Care | Payer: 59 | Attending: Emergency Medicine | Admitting: Emergency Medicine

## 2021-02-05 ENCOUNTER — Encounter (HOSPITAL_BASED_OUTPATIENT_CLINIC_OR_DEPARTMENT_OTHER): Payer: Self-pay | Admitting: *Deleted

## 2021-02-05 DIAGNOSIS — Z79899 Other long term (current) drug therapy: Secondary | ICD-10-CM | POA: Insufficient documentation

## 2021-02-05 DIAGNOSIS — J4540 Moderate persistent asthma, uncomplicated: Secondary | ICD-10-CM | POA: Insufficient documentation

## 2021-02-05 DIAGNOSIS — R1013 Epigastric pain: Secondary | ICD-10-CM | POA: Insufficient documentation

## 2021-02-05 DIAGNOSIS — Z7951 Long term (current) use of inhaled steroids: Secondary | ICD-10-CM | POA: Diagnosis not present

## 2021-02-05 DIAGNOSIS — R0602 Shortness of breath: Secondary | ICD-10-CM | POA: Insufficient documentation

## 2021-02-05 DIAGNOSIS — R319 Hematuria, unspecified: Secondary | ICD-10-CM | POA: Diagnosis not present

## 2021-02-05 DIAGNOSIS — J449 Chronic obstructive pulmonary disease, unspecified: Secondary | ICD-10-CM | POA: Insufficient documentation

## 2021-02-05 DIAGNOSIS — F17210 Nicotine dependence, cigarettes, uncomplicated: Secondary | ICD-10-CM | POA: Diagnosis not present

## 2021-02-05 DIAGNOSIS — R109 Unspecified abdominal pain: Secondary | ICD-10-CM

## 2021-02-05 LAB — URINALYSIS, ROUTINE W REFLEX MICROSCOPIC
Bilirubin Urine: NEGATIVE
Glucose, UA: NEGATIVE mg/dL
Ketones, ur: NEGATIVE mg/dL
Leukocytes,Ua: NEGATIVE
Nitrite: NEGATIVE
Protein, ur: NEGATIVE mg/dL
Specific Gravity, Urine: <1.005 — ABNORMAL LOW (ref 1.005–1.030)
pH: 5 (ref 5.0–8.0)

## 2021-02-05 LAB — CBC WITH DIFFERENTIAL/PLATELET
Abs Immature Granulocytes: 0.03 10*3/uL (ref 0.00–0.07)
Basophils Absolute: 0.1 10*3/uL (ref 0.0–0.1)
Basophils Relative: 1 %
Eosinophils Absolute: 0.2 10*3/uL (ref 0.0–0.5)
Eosinophils Relative: 2 %
HCT: 47.4 % — ABNORMAL HIGH (ref 36.0–46.0)
Hemoglobin: 16.2 g/dL — ABNORMAL HIGH (ref 12.0–15.0)
Immature Granulocytes: 0 %
Lymphocytes Relative: 31 %
Lymphs Abs: 2.5 10*3/uL (ref 0.7–4.0)
MCH: 33.3 pg (ref 26.0–34.0)
MCHC: 34.2 g/dL (ref 30.0–36.0)
MCV: 97.3 fL (ref 80.0–100.0)
Monocytes Absolute: 0.5 10*3/uL (ref 0.1–1.0)
Monocytes Relative: 6 %
Neutro Abs: 4.7 10*3/uL (ref 1.7–7.7)
Neutrophils Relative %: 60 %
Platelets: 208 10*3/uL (ref 150–400)
RBC: 4.87 MIL/uL (ref 3.87–5.11)
RDW: 11.9 % (ref 11.5–15.5)
WBC: 7.9 10*3/uL (ref 4.0–10.5)
nRBC: 0 % (ref 0.0–0.2)

## 2021-02-05 LAB — COMPREHENSIVE METABOLIC PANEL
ALT: 19 U/L (ref 0–44)
AST: 24 U/L (ref 15–41)
Albumin: 4.6 g/dL (ref 3.5–5.0)
Alkaline Phosphatase: 94 U/L (ref 38–126)
Anion gap: 8 (ref 5–15)
BUN: 14 mg/dL (ref 6–20)
CO2: 27 mmol/L (ref 22–32)
Calcium: 9.7 mg/dL (ref 8.9–10.3)
Chloride: 103 mmol/L (ref 98–111)
Creatinine, Ser: 0.59 mg/dL (ref 0.44–1.00)
GFR, Estimated: >60 mL/min (ref >60)
Glucose, Bld: 88 mg/dL (ref 70–99)
Potassium: 4.3 mmol/L (ref 3.5–5.1)
Sodium: 138 mmol/L (ref 135–145)
Total Bilirubin: 0.3 mg/dL (ref 0.3–1.2)
Total Protein: 7.9 g/dL (ref 6.5–8.1)

## 2021-02-05 LAB — LIPASE, BLOOD: Lipase: 43 U/L (ref 11–51)

## 2021-02-05 LAB — URINALYSIS, MICROSCOPIC (REFLEX): Bacteria, UA: NONE SEEN

## 2021-02-05 MED ORDER — IOHEXOL 300 MG/ML  SOLN
100.0000 mL | Freq: Once | INTRAMUSCULAR | Status: AC | PRN
  Administered 2021-02-05: 100 mL via INTRAVENOUS

## 2021-02-05 MED ORDER — SUCRALFATE 1 GM/10ML PO SUSP
1.0000 g | Freq: Once | ORAL | Status: AC
  Administered 2021-02-05: 1 g via ORAL
  Filled 2021-02-05: qty 10

## 2021-02-05 MED ORDER — MORPHINE SULFATE (PF) 4 MG/ML IV SOLN
4.0000 mg | Freq: Once | INTRAVENOUS | Status: AC
  Administered 2021-02-05: 4 mg via INTRAVENOUS
  Filled 2021-02-05: qty 1

## 2021-02-05 MED ORDER — PANTOPRAZOLE SODIUM 40 MG IV SOLR
40.0000 mg | Freq: Once | INTRAVENOUS | Status: AC
  Administered 2021-02-05: 40 mg via INTRAVENOUS
  Filled 2021-02-05: qty 40

## 2021-02-05 MED ORDER — SODIUM CHLORIDE 0.9 % IV BOLUS
1000.0000 mL | Freq: Once | INTRAVENOUS | Status: AC
  Administered 2021-02-05: 1000 mL via INTRAVENOUS

## 2021-02-05 MED ORDER — LIDOCAINE VISCOUS HCL 2 % MT SOLN
15.0000 mL | Freq: Once | OROMUCOSAL | Status: AC
  Administered 2021-02-05: 15 mL via ORAL
  Filled 2021-02-05: qty 15

## 2021-02-05 MED ORDER — PANTOPRAZOLE SODIUM 20 MG PO TBEC
20.0000 mg | DELAYED_RELEASE_TABLET | Freq: Every day | ORAL | 0 refills | Status: AC
  Filled 2021-02-05: qty 14, 14d supply, fill #0

## 2021-02-05 MED ORDER — FAMOTIDINE IN NACL 20-0.9 MG/50ML-% IV SOLN
20.0000 mg | Freq: Once | INTRAVENOUS | Status: AC
  Administered 2021-02-05: 20 mg via INTRAVENOUS
  Filled 2021-02-05: qty 50

## 2021-02-05 MED ORDER — ALUM & MAG HYDROXIDE-SIMETH 200-200-20 MG/5ML PO SUSP
30.0000 mL | Freq: Once | ORAL | Status: AC
  Administered 2021-02-05: 30 mL via ORAL
  Filled 2021-02-05: qty 30
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
## 2021-02-05 NOTE — ED Notes (Signed)
Patient transported to CT 

## 2021-02-05 NOTE — ED Triage Notes (Signed)
Epigastric pain with pain under both breast. Pain started yesterday. Sharp pain that is constant. She tried antiacids without relief. EKG at triage.

## 2021-02-05 NOTE — ED Notes (Signed)
Tolerated po Coke without N/V

## 2021-02-05 NOTE — Discharge Instructions (Addendum)
Take medications as prescribed   Follow up with gastroenterology  Please follow up with your primary doctor within the next 5-7 days.  If you do not have a primary care provider, information for a healthcare clinic has been provided for you to make arrangements for follow up care. Please return to the ER sooner if you have any new or worsening symptoms, or if you have any of the following symptoms:  Abdominal pain that does not go away.  You have a fever.  You keep throwing up (vomiting).  The pain is felt only in portions of the abdomen. Pain in the right side could possibly be appendicitis. In an adult, pain in the left lower portion of the abdomen could be colitis or diverticulitis.  You pass bloody or black tarry stools.  There is bright red blood in the stool.  The constipation stays for more than 4 days.  There is belly (abdominal) or rectal pain.  You do not seem to be getting better.  You have any questions or concerns.

## 2021-02-06 ENCOUNTER — Other Ambulatory Visit (HOSPITAL_BASED_OUTPATIENT_CLINIC_OR_DEPARTMENT_OTHER): Payer: Self-pay

## 2021-02-13 ENCOUNTER — Other Ambulatory Visit (HOSPITAL_BASED_OUTPATIENT_CLINIC_OR_DEPARTMENT_OTHER): Payer: Self-pay

## 2021-07-03 ENCOUNTER — Other Ambulatory Visit: Payer: Self-pay | Admitting: Obstetrics and Gynecology

## 2021-07-03 DIAGNOSIS — Z1231 Encounter for screening mammogram for malignant neoplasm of breast: Secondary | ICD-10-CM

## 2021-08-14 ENCOUNTER — Ambulatory Visit
Admission: RE | Admit: 2021-08-14 | Discharge: 2021-08-14 | Disposition: A | Discharge status: Home / Self Care | Payer: 59 | Source: Ambulatory Visit | Attending: Obstetrics and Gynecology | Admitting: Obstetrics and Gynecology

## 2021-08-14 DIAGNOSIS — Z1231 Encounter for screening mammogram for malignant neoplasm of breast: Secondary | ICD-10-CM

## 2021-08-20 ENCOUNTER — Other Ambulatory Visit: Payer: Self-pay | Admitting: Obstetrics and Gynecology

## 2021-08-20 DIAGNOSIS — N6320 Unspecified lump in the left breast, unspecified quadrant: Secondary | ICD-10-CM

## 2021-09-02 ENCOUNTER — Other Ambulatory Visit: Payer: Self-pay | Admitting: Obstetrics and Gynecology

## 2021-09-02 ENCOUNTER — Ambulatory Visit
Admission: RE | Admit: 2021-09-02 | Discharge: 2021-09-02 | Disposition: A | Discharge status: Home / Self Care | Payer: Medicare Other | Source: Ambulatory Visit | Attending: Obstetrics and Gynecology | Admitting: Obstetrics and Gynecology

## 2021-09-02 ENCOUNTER — Ambulatory Visit
Admission: RE | Admit: 2021-09-02 | Discharge: 2021-09-02 | Disposition: A | Discharge status: Home / Self Care | Payer: Self-pay | Source: Ambulatory Visit | Attending: Obstetrics and Gynecology | Admitting: Obstetrics and Gynecology

## 2021-09-02 DIAGNOSIS — N6311 Unspecified lump in the right breast, upper outer quadrant: Secondary | ICD-10-CM

## 2021-09-02 DIAGNOSIS — N6320 Unspecified lump in the left breast, unspecified quadrant: Secondary | ICD-10-CM

## 2021-09-16 ENCOUNTER — Other Ambulatory Visit: Payer: Self-pay

## 2023-07-06 ENCOUNTER — Telehealth: Payer: Self-pay

## 2023-07-06 NOTE — Telephone Encounter (Signed)
Additional records received for patient,   Left message for patient to call back   Need  Reason for transfer of care  Specific provider if requesting  Patient last seen in 2024.

## 2023-07-06 NOTE — Telephone Encounter (Signed)
Good afternoon Dr. Rhea Belton,   Supervising Provider PM 11/20  We received a call from this patient wishing to schedule an appointment to be evaluated for constipation, and abdominal pain. Patient has previous GI history with [facility] and was last seen by them in 02/2023. Patient states she would like to establish with [facility] due to it's location. Additional records were received and scanned into Media for your review. Would you please review and advise on scheduling?  Thank you.

## 2023-07-06 NOTE — Telephone Encounter (Signed)
Request received to transfer GI care from outside practice to [facility].  We appreciate the interest in our practice, however at this time due to high demand from patients without established GI providers we cannot accommodate this transfer.      

## 2023-07-19 ENCOUNTER — Emergency Department (HOSPITAL_BASED_OUTPATIENT_CLINIC_OR_DEPARTMENT_OTHER)
Admission: EM | Admit: 2023-07-19 | Discharge: 2023-07-19 | Disposition: A | Discharge status: Home / Self Care | Payer: 59 | Attending: Emergency Medicine | Admitting: Emergency Medicine

## 2023-07-19 ENCOUNTER — Encounter (HOSPITAL_BASED_OUTPATIENT_CLINIC_OR_DEPARTMENT_OTHER): Payer: Self-pay

## 2023-07-19 ENCOUNTER — Other Ambulatory Visit: Payer: Self-pay

## 2023-07-19 DIAGNOSIS — J45909 Unspecified asthma, uncomplicated: Secondary | ICD-10-CM | POA: Diagnosis not present

## 2023-07-19 DIAGNOSIS — J449 Chronic obstructive pulmonary disease, unspecified: Secondary | ICD-10-CM | POA: Insufficient documentation

## 2023-07-19 DIAGNOSIS — Z7951 Long term (current) use of inhaled steroids: Secondary | ICD-10-CM | POA: Insufficient documentation

## 2023-07-19 DIAGNOSIS — J3489 Other specified disorders of nose and nasal sinuses: Secondary | ICD-10-CM | POA: Diagnosis present
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
## 2023-07-19 NOTE — Discharge Instructions (Signed)
Thank you for letting us evaluate you today.  I do not see any concerning physical exam findings such as a septal hematoma on your exam. Please follow up with the ENT recommendation I provided above for further management.  Please return to ED if you have visual disturbances (blurred vision, loss of vision, etc), loss of consciousness, bruising around eyes or behind the ears

## 2023-07-19 NOTE — ED Notes (Signed)
Discharge instructions reviewed with patient. Patient verbalizes understanding, no further questions at this time. Medications and follow up information provided. No acute distress noted at time of departure.  
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
## 2023-07-21 NOTE — Telephone Encounter (Signed)
Patient advised.

## 2023-07-26 NOTE — Telephone Encounter (Signed)
Patient advised of previous recommendations.

## 2023-07-26 NOTE — Telephone Encounter (Signed)
Patient called to schedule from referral received, was advised of recent transfer of care denial.

## 2023-08-26 ENCOUNTER — Telehealth (INDEPENDENT_AMBULATORY_CARE_PROVIDER_SITE_OTHER): Payer: Self-pay | Admitting: Otolaryngology

## 2023-08-26 NOTE — Telephone Encounter (Signed)
 Confirmed appt & location [id number] afm

## 2023-08-29 ENCOUNTER — Encounter (INDEPENDENT_AMBULATORY_CARE_PROVIDER_SITE_OTHER): Payer: Self-pay

## 2023-08-29 ENCOUNTER — Ambulatory Visit (INDEPENDENT_AMBULATORY_CARE_PROVIDER_SITE_OTHER): Payer: 59 | Admitting: Otolaryngology

## 2023-08-29 VITALS — BP 120/79 | HR 70 | Ht 63.0 in | Wt 99.0 lb

## 2023-08-29 DIAGNOSIS — J310 Chronic rhinitis: Secondary | ICD-10-CM | POA: Diagnosis not present

## 2023-08-29 DIAGNOSIS — S022XXA Fracture of nasal bones, initial encounter for closed fracture: Secondary | ICD-10-CM | POA: Diagnosis not present

## 2023-08-29 DIAGNOSIS — R0981 Nasal congestion: Secondary | ICD-10-CM | POA: Diagnosis not present

## 2023-08-29 DIAGNOSIS — J342 Deviated nasal septum: Secondary | ICD-10-CM | POA: Diagnosis not present

## 2023-08-30 DIAGNOSIS — S022XXA Fracture of nasal bones, initial encounter for closed fracture: Secondary | ICD-10-CM | POA: Insufficient documentation
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
## 2024-01-04 IMAGING — MG DIGITAL DIAGNOSTIC BILAT W/ TOMO W/ CAD
8 of 16 series · 8 of 40 positions shown · non-contrast
Comparison: Previous exam(s).

CLINICAL DATA: 50-year-old female presenting with a new lump and
tenderness along the upper left chest wall.



[R ML synth-2D (1 of 2)]
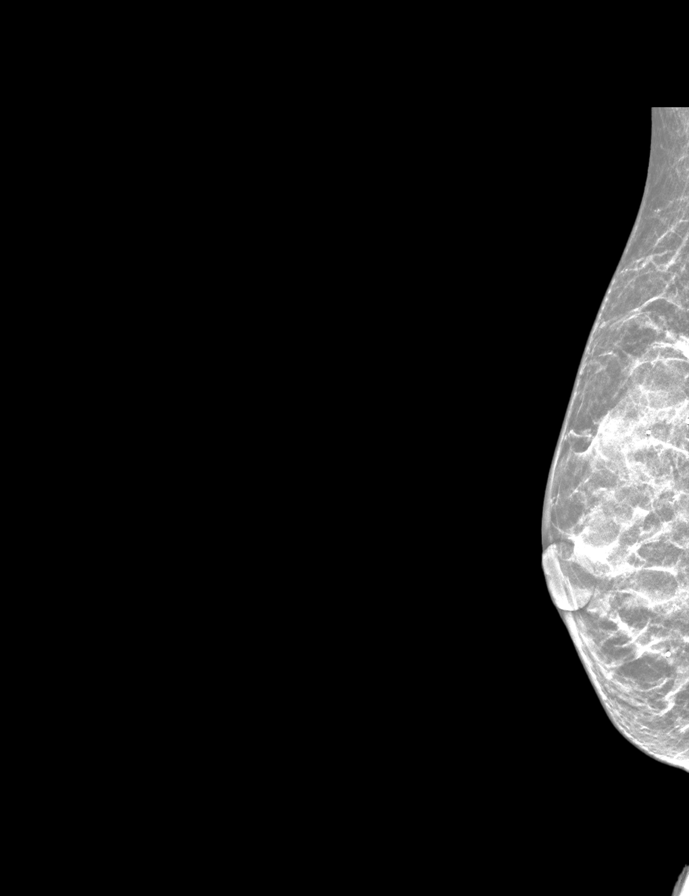

[L CC synth-2D]
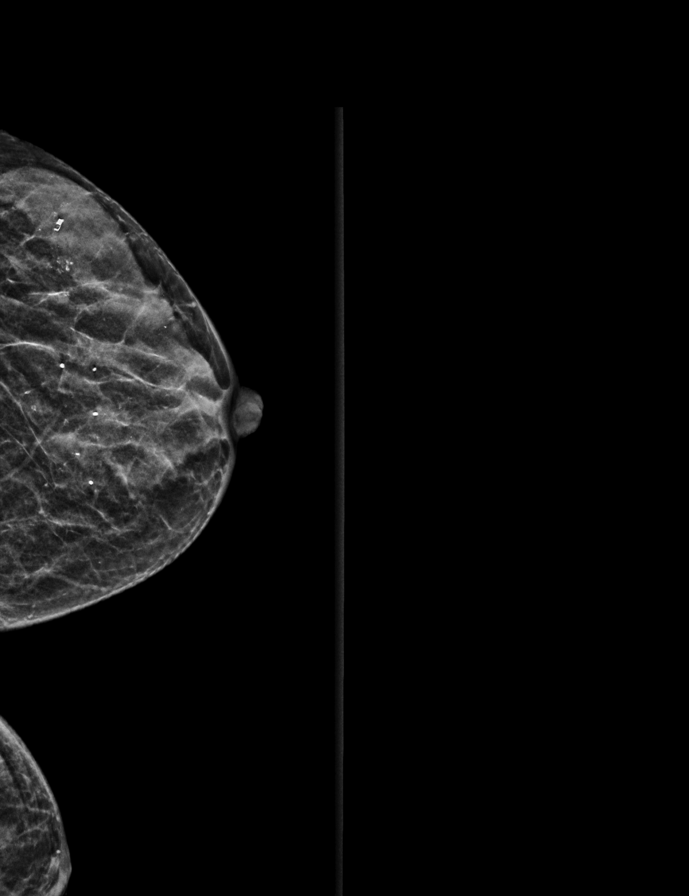

[R ML synth-2D (2 of 2)]
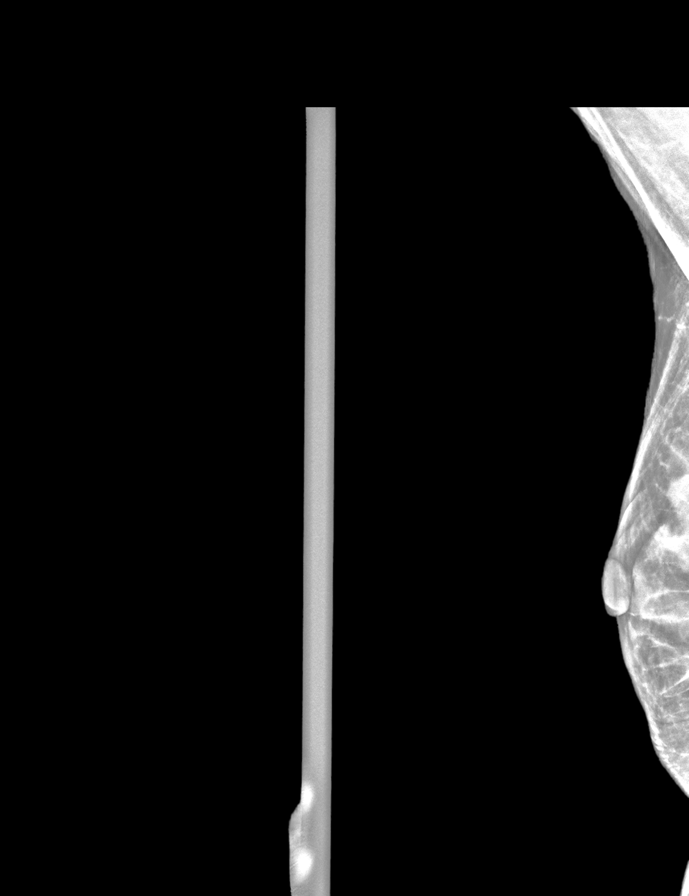

[L TAN synth-2D]
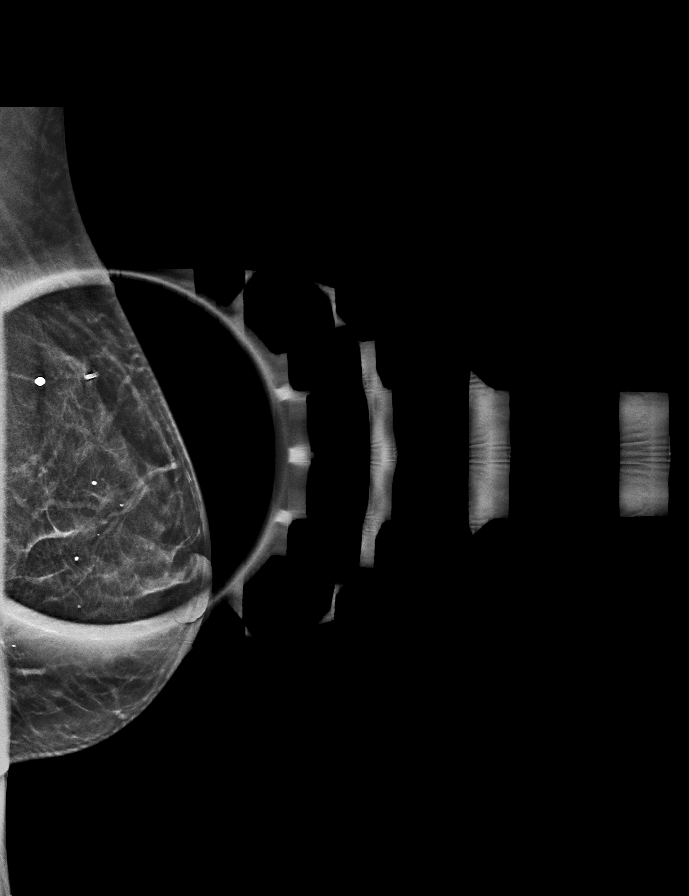

[R CC synth-2D]
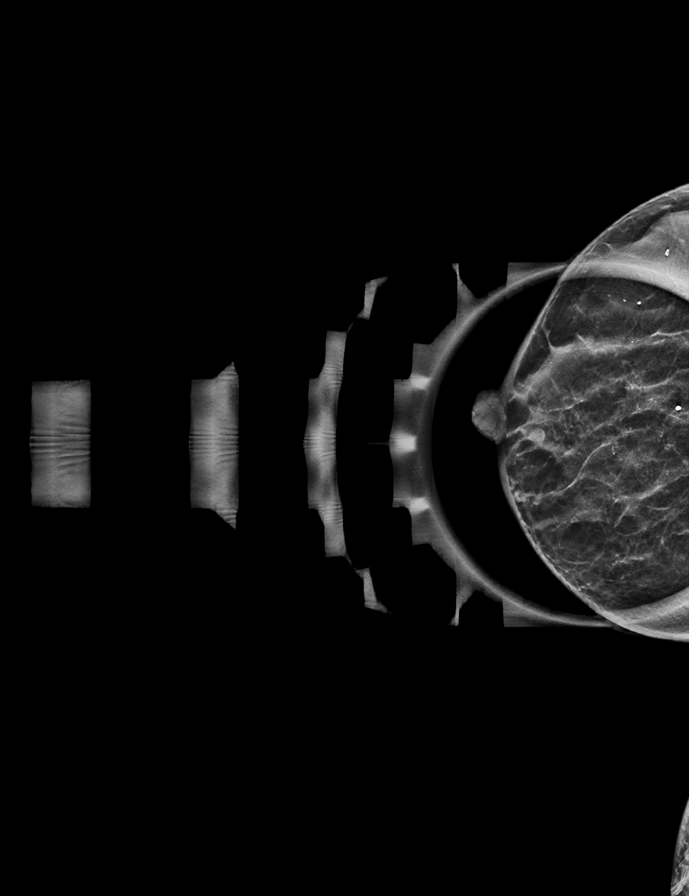

[R MLO synth-2D]
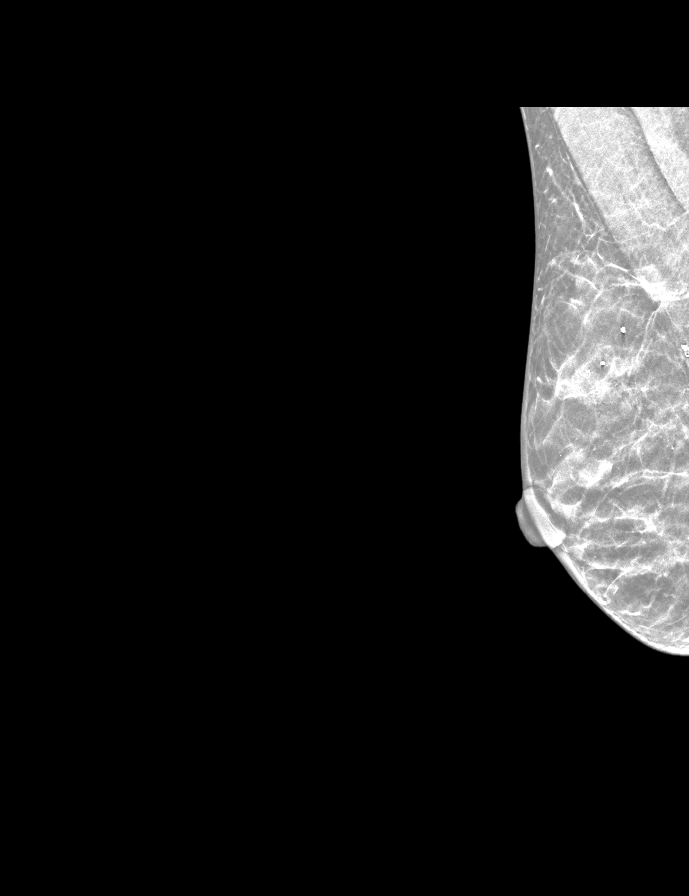

[L MLO synth-2D]
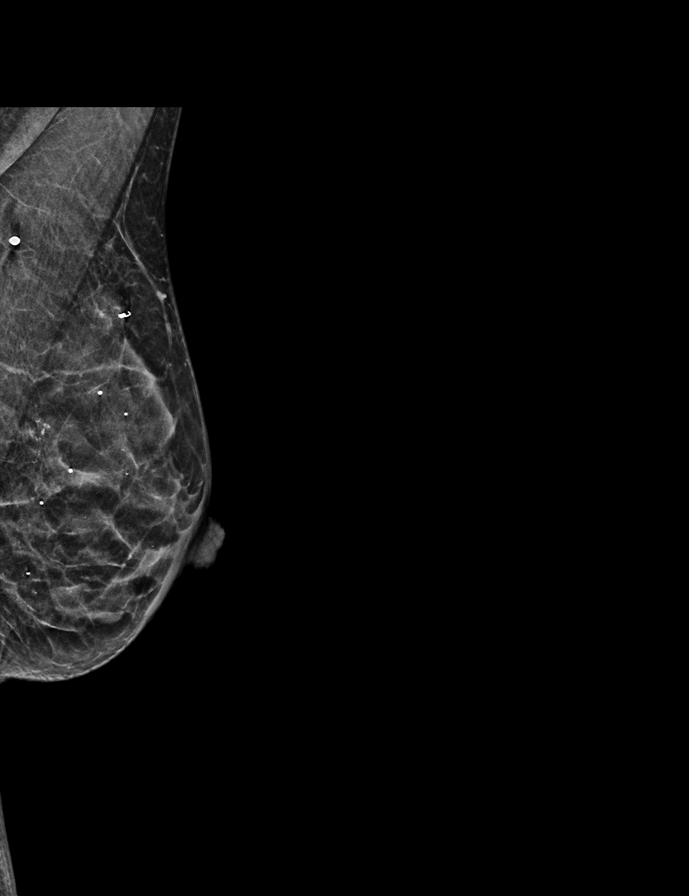

[R ML tomo · tomo slice 14/27.0]
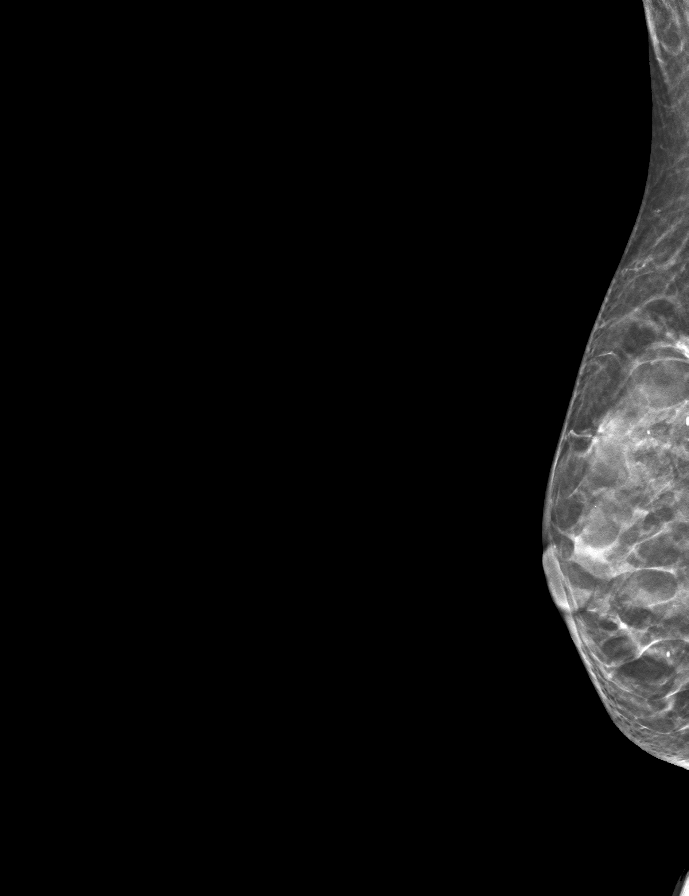

[8 of 40 positions shown; findings below may reference images not displayed]

ACR Breast Density Category c: The breast tissue is heterogeneously
dense, which may obscure small masses.
FINDINGS: Mammogram:

Right breast: Spot compression tomosynthesis views of the
retroareolar right breast were performed in addition to standard
views. There is a small obscured mass seen only on the cc view in
the retroareolar right breast measuring approximately 5 mm.
Additionally a spot compression MLO and full true lateral mL views
were performed for a possible asymmetry in the superior and far
posterior left breast. The asymmetry is less conspicuous on the spot
imaging and consistent with normal fibroglandular tissue.

Left breast: A skin BB marks the palpable site of concern reported
by the patient in the superior central left breast. A spot
tangential view of this area was performed in addition to standard
views demonstrating no new abnormality. There is no new suspicious
finding in the left breast.

On physical exam at the site of concern reported by the patient
along the superior central left chest wall I do not feel a discrete
mass or focal area of thickening.

Ultrasound:

Right breast: Targeted ultrasound performed throughout the
retroareolar aspect of the right breast demonstrating 2 oval
circumscribed anechoic masses with thin septation, consistent with
benign cysts. One of these at 8 o'clock measures 0.5 cm and the
second at 4 o'clock measures 0.6 cm. No suspicious solid mass.

Left breast: Targeted ultrasound performed at the palpable site of
concern reported by the patient at 11 o'clock 8 cm from the nipple
demonstrating no cystic or solid mass.
IMPRESSION: 1.  Benign subcentimeter cysts in the retroareolar right breast.

2. No mammographic or sonographic evidence of malignancy at the
palpable site of concern in the superior central left breast/chest
wall.

RECOMMENDATION:
1. Recommend any further workup of the palpable site along the left
chest wall be on a clinical basis.

2.  Screening mammogram in one year.(Code:KJ-S-JCF)

I have discussed the findings and recommendations with the patient.
If applicable, a reminder letter will be sent to the patient
regarding the next appointment.

BI-RADS CATEGORY  2: Benign.

## 2024-08-30 ENCOUNTER — Other Ambulatory Visit: Payer: Self-pay

## 2024-08-30 DIAGNOSIS — I8393 Asymptomatic varicose veins of bilateral lower extremities: Secondary | ICD-10-CM

## 2024-09-19 ENCOUNTER — Inpatient Hospital Stay: Admitting: Nurse Practitioner

## 2024-09-19 ENCOUNTER — Inpatient Hospital Stay: Attending: Nurse Practitioner

## 2024-09-19 VITALS — BP 132/90 | HR 80 | Temp 98.0°F | Resp 17 | Wt 96.1 lb

## 2024-09-19 DIAGNOSIS — R233 Spontaneous ecchymoses: Secondary | ICD-10-CM

## 2024-09-19 DIAGNOSIS — F17210 Nicotine dependence, cigarettes, uncomplicated: Secondary | ICD-10-CM

## 2024-09-19 DIAGNOSIS — Z800 Family history of malignant neoplasm of digestive organs: Secondary | ICD-10-CM | POA: Insufficient documentation

## 2024-09-19 DIAGNOSIS — T148XXA Other injury of unspecified body region, initial encounter: Secondary | ICD-10-CM

## 2024-09-19 DIAGNOSIS — G629 Polyneuropathy, unspecified: Secondary | ICD-10-CM

## 2024-09-19 DIAGNOSIS — K589 Irritable bowel syndrome without diarrhea: Secondary | ICD-10-CM | POA: Insufficient documentation

## 2024-09-19 DIAGNOSIS — J4489 Other specified chronic obstructive pulmonary disease: Secondary | ICD-10-CM | POA: Insufficient documentation

## 2024-09-19 LAB — CMP (CANCER CENTER ONLY)
ALT: 21 U/L (ref 0–44)
AST: 32 U/L (ref 15–41)
Albumin: 5.2 g/dL — ABNORMAL HIGH (ref 3.5–5.0)
Alkaline Phosphatase: 130 U/L — ABNORMAL HIGH (ref 38–126)
Anion gap: 10 (ref 5–15)
BUN: 10 mg/dL (ref 6–20)
CO2: 28 mmol/L (ref 22–32)
Calcium: 10.2 mg/dL (ref 8.9–10.3)
Chloride: 103 mmol/L (ref 98–111)
Creatinine: 0.7 mg/dL (ref 0.44–1.00)
GFR, Estimated: >60 mL/min
Glucose, Bld: 90 mg/dL (ref 70–99)
Potassium: 4.1 mmol/L (ref 3.5–5.1)
Sodium: 142 mmol/L (ref 135–145)
Total Bilirubin: 0.5 mg/dL (ref 0.0–1.2)
Total Protein: 8.6 g/dL — ABNORMAL HIGH (ref 6.5–8.1)

## 2024-09-19 LAB — CBC WITH DIFFERENTIAL (CANCER CENTER ONLY)
Abs Immature Granulocytes: 0.03 10*3/uL (ref 0.00–0.07)
Basophils Absolute: 0.1 10*3/uL (ref 0.0–0.1)
Basophils Relative: 1 %
Eosinophils Absolute: 0.1 10*3/uL (ref 0.0–0.5)
Eosinophils Relative: 2 %
HCT: 52.2 % — ABNORMAL HIGH (ref 36.0–46.0)
Hemoglobin: 17.5 g/dL — ABNORMAL HIGH (ref 12.0–15.0)
Immature Granulocytes: 0 %
Lymphocytes Relative: 24 %
Lymphs Abs: 1.9 10*3/uL (ref 0.7–4.0)
MCH: 31.7 pg (ref 26.0–34.0)
MCHC: 33.5 g/dL (ref 30.0–36.0)
MCV: 94.6 fL (ref 80.0–100.0)
Monocytes Absolute: 0.4 10*3/uL (ref 0.1–1.0)
Monocytes Relative: 5 %
Neutro Abs: 5.6 10*3/uL (ref 1.7–7.7)
Neutrophils Relative %: 68 %
Platelet Count: 209 10*3/uL (ref 150–400)
RBC: 5.52 MIL/uL — ABNORMAL HIGH (ref 3.87–5.11)
RDW: 12 % (ref 11.5–15.5)
WBC Count: 8.1 10*3/uL (ref 4.0–10.5)
nRBC: 0 % (ref 0.0–0.2)

## 2024-09-19 LAB — PROTIME-INR
INR: 1 (ref 0.8–1.2)
Prothrombin Time: 13.2 s (ref 11.4–15.2)

## 2024-09-19 LAB — APTT: aPTT: 27 s (ref 24–36)
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
## 2024-09-20 ENCOUNTER — Ambulatory Visit (HOSPITAL_COMMUNITY)

## 2024-09-21 ENCOUNTER — Telehealth: Payer: Self-pay

## 2024-09-21 ENCOUNTER — Telehealth: Payer: Self-pay | Admitting: Nurse Practitioner

## 2024-09-21 NOTE — Telephone Encounter (Signed)
 Pt called inquiring about her recent lab results.  Pt stated Makayla Burton, NP said she would give the pt a call today to discuss the pt's lab results.  Stated that Makayla is out of the office on Friday but I will ask someone from Makayla's Team to f/u with pt on the lab results.  Pt verbalized understanding.

## 2024-09-21 NOTE — Telephone Encounter (Signed)
 Called and spoke with patient regarding lab results. Pt, ptt, and inr were all within normal limits. WBC and white blood cells are all good. Her Hgb and Hct are elevated. This is likely to be secondary from history of smoking and COPD.She is scheduled for US  of the abdomen for evaluation of liver. She did have a small area of concern for cirrhosis on CT abdomen and pelvis in 2022. US  will be on 09/26/2024.we will contact her with those results once they are available. All questions were answered. She voiced understanding. She knows to contact the clinic with further questions.  -Powell Lessen, NP

## 2024-09-26 ENCOUNTER — Ambulatory Visit (HOSPITAL_COMMUNITY)
